# Patient Record
Sex: Female | Born: 1995 | Race: White | Hispanic: No | Marital: Single | Smoking: Former smoker
Health system: Southern US, Community
[De-identification: ages and names within clinical notes are randomized; demographics above are authoritative.]

## PROBLEM LIST (undated history)

## (undated) DIAGNOSIS — Z789 Other specified health status: Secondary | ICD-10-CM

## (undated) HISTORY — PX: TONSILLECTOMY: SUR1361

## (undated) HISTORY — PX: WISDOM TOOTH EXTRACTION: SHX21

---

## 2016-12-17 ENCOUNTER — Emergency Department (HOSPITAL_BASED_OUTPATIENT_CLINIC_OR_DEPARTMENT_OTHER): Payer: Managed Care, Other (non HMO)

## 2016-12-17 ENCOUNTER — Emergency Department (HOSPITAL_BASED_OUTPATIENT_CLINIC_OR_DEPARTMENT_OTHER)
Admission: EM | Admit: 2016-12-17 | Discharge: 2016-12-17 | Disposition: A | Discharge status: Home / Self Care | Payer: Managed Care, Other (non HMO) | Attending: Emergency Medicine | Admitting: Emergency Medicine

## 2016-12-17 ENCOUNTER — Encounter (HOSPITAL_BASED_OUTPATIENT_CLINIC_OR_DEPARTMENT_OTHER): Payer: Self-pay | Admitting: Emergency Medicine

## 2016-12-17 DIAGNOSIS — R1030 Lower abdominal pain, unspecified: Secondary | ICD-10-CM | POA: Diagnosis not present

## 2016-12-17 DIAGNOSIS — F17210 Nicotine dependence, cigarettes, uncomplicated: Secondary | ICD-10-CM | POA: Diagnosis not present

## 2016-12-17 DIAGNOSIS — N83202 Unspecified ovarian cyst, left side: Secondary | ICD-10-CM

## 2016-12-17 DIAGNOSIS — K625 Hemorrhage of anus and rectum: Secondary | ICD-10-CM | POA: Diagnosis present

## 2016-12-17 LAB — CBC WITH DIFFERENTIAL/PLATELET
BASOS ABS: 0 10*3/uL (ref 0.0–0.1)
BASOS PCT: 0 %
EOS ABS: 0.3 10*3/uL (ref 0.0–0.7)
EOS PCT: 2 %
HCT: 40.7 % (ref 36.0–46.0)
Hemoglobin: 13.7 g/dL (ref 12.0–15.0)
LYMPHS PCT: 24 %
Lymphs Abs: 3.2 10*3/uL (ref 0.7–4.0)
MCH: 29.6 pg (ref 26.0–34.0)
MCHC: 33.7 g/dL (ref 30.0–36.0)
MCV: 87.9 fL (ref 78.0–100.0)
MONO ABS: 1.3 10*3/uL — AB (ref 0.1–1.0)
Monocytes Relative: 9 %
Neutro Abs: 8.6 10*3/uL — ABNORMAL HIGH (ref 1.7–7.7)
Neutrophils Relative %: 65 %
PLATELETS: 303 10*3/uL (ref 150–400)
RBC: 4.63 MIL/uL (ref 3.87–5.11)
RDW: 13.3 % (ref 11.5–15.5)
WBC: 13.4 10*3/uL — AB (ref 4.0–10.5)

## 2016-12-17 LAB — BASIC METABOLIC PANEL WITH GFR
Anion gap: 8 (ref 5–15)
BUN: 16 mg/dL (ref 6–20)
CO2: 26 mmol/L (ref 22–32)
Calcium: 9.1 mg/dL (ref 8.9–10.3)
Chloride: 105 mmol/L (ref 101–111)
Creatinine, Ser: 0.75 mg/dL (ref 0.44–1.00)
GFR calc Af Amer: >60 mL/min
GFR calc non Af Amer: >60 mL/min
Glucose, Bld: 92 mg/dL (ref 65–99)
Potassium: 3.8 mmol/L (ref 3.5–5.1)
Sodium: 139 mmol/L (ref 135–145)

## 2016-12-17 LAB — PREGNANCY, URINE: Preg Test, Ur: NEGATIVE

## 2016-12-17 LAB — OCCULT BLOOD X 1 CARD TO LAB, STOOL: FECAL OCCULT BLD: POSITIVE — AB

## 2016-12-17 MED ORDER — METRONIDAZOLE 500 MG PO TABS: 500.0000 mg | ORAL_TABLET | Freq: Three times a day (TID) | ORAL | 0 refills | Status: DC

## 2016-12-17 MED ORDER — HYDROCORTISONE ACETATE 25 MG RE SUPP: 25.0000 mg | Freq: Two times a day (BID) | RECTAL | 0 refills | Status: DC

## 2016-12-17 MED ORDER — IOPAMIDOL (ISOVUE-300) INJECTION 61%
100.0000 mL | Freq: Once | INTRAVENOUS | Status: AC | PRN
  Administered 2016-12-17: 100 mL via INTRAVENOUS

## 2016-12-17 NOTE — ED Triage Notes (Signed)
Pt states visible blood with BM's x 2 days. Denies dyspnea on exertion or lightheadedness when standing, or other symptoms.

## 2016-12-17 NOTE — ED Provider Notes (Signed)
[facility] Provider Note   CSN: [id number] Arrival date & time: 12/17/16  4098     History   Chief Complaint Chief Complaint  Patient presents with  . Rectal Bleeding    HPI Heidi David is a 20 y.o. female.  Patient presents to the emergency part for evaluation of rectal bleeding. Patient reports for the last 2 days she has had bright red blood mixed with her stool in the morning. She has had some discomfort in the low abdomen as well. No nausea or vomiting associated with symptoms. There has not been diarrhea.      History reviewed. No pertinent past medical history.  There are no active problems to display for this patient.   Past Surgical History:  Procedure Laterality Date  . TONSILLECTOMY     tonsils and adenoids  . WISDOM TOOTH EXTRACTION      OB History    No data available       Home Medications    Prior to Admission medications   Not on File    Family History No family history on file.  Social History Social History  Substance Use Topics  . Smoking status: Current Every Day Smoker    Packs/day: 0.50    Types: Cigarettes  . Smokeless tobacco: Never Used  . Alcohol use Yes     Comment: occasional     Allergies   Codeine; Hydrocodone; and Oxycodone   Review of Systems Review of Systems  Gastrointestinal: Positive for blood in stool. Negative for diarrhea.  All other systems reviewed and are negative.    Physical Exam Updated Vital Signs BP 126/77 (BP Location: Left Arm)   Pulse 95   Temp 98.3 F (36.8 C) (Oral)   Resp 16   Ht 5\' 2"  (1.575 m)   Wt 160 lb (72.6 kg)   LMP 11/24/2016 (Exact Date)   SpO2 99%   BMI 29.26 kg/m   Physical Exam  Constitutional: She is oriented to person, place, and time. She appears well-developed and well-nourished. No distress.  HENT:  Head: Normocephalic and atraumatic.  Right Ear: Hearing normal.  Left Ear: Hearing normal.  Nose: Nose normal.  Mouth/Throat: Oropharynx is  clear and moist and mucous membranes are normal.  Eyes: Conjunctivae and EOM are normal. Pupils are equal, round, and reactive to light.  Neck: Normal range of motion. Neck supple.  Cardiovascular: Regular rhythm, S1 normal and S2 normal.  Exam reveals no gallop and no friction rub.   No murmur heard. Pulmonary/Chest: Effort normal and breath sounds normal. No respiratory distress. She exhibits no tenderness.  Abdominal: Soft. Normal appearance and bowel sounds are normal. There is no hepatosplenomegaly. There is no tenderness. There is no rebound, no guarding, no tenderness at McBurney's point and negative Murphy's sign. No hernia.  Genitourinary: Rectum normal. Rectal exam shows no external hemorrhoid, no internal hemorrhoid and no mass.  Musculoskeletal: Normal range of motion.  Neurological: She is alert and oriented to person, place, and time. She has normal strength. No cranial nerve deficit or sensory deficit. Coordination normal. GCS eye subscore is 4. GCS verbal subscore is 5. GCS motor subscore is 6.  Skin: Skin is warm, dry and intact. No rash noted. No cyanosis.  Psychiatric: She has a normal mood and affect. Her speech is normal and behavior is normal. Thought content normal.  Nursing note and vitals reviewed.    ED Treatments / Results  Labs (all labs ordered are listed, but only abnormal results  are displayed) Labs Reviewed  CBC WITH DIFFERENTIAL/PLATELET - Abnormal; Notable for the following:       Result Value   WBC 13.4 (*)    Neutro Abs 8.6 (*)    Monocytes Absolute 1.3 (*)    All other components within normal limits  OCCULT BLOOD X 1 CARD TO LAB, STOOL - Abnormal; Notable for the following:    Fecal Occult Bld POSITIVE (*)    All other components within normal limits  BASIC METABOLIC PANEL  PREGNANCY, URINE    EKG  EKG Interpretation None       Radiology Ct Abdomen Pelvis W Contrast  Result Date: 12/17/2016 CLINICAL DATA:  Lower abdominal pain with  rectal bleeding for 2 days. EXAM: CT ABDOMEN AND PELVIS WITH CONTRAST TECHNIQUE: Multidetector CT imaging of the abdomen and pelvis was performed using the standard protocol following bolus administration of intravenous contrast. CONTRAST:  100mL ISOVUE-300 IOPAMIDOL (ISOVUE-300) INJECTION 61% COMPARISON:  None. FINDINGS: Lower chest: The visualized lung bases are clear. Hepatobiliary: No focal liver abnormality is seen. No gallstones, gallbladder wall thickening, or biliary dilatation. Pancreas: Unremarkable aside from a subcentimeter focus of fat in the pancreatic head. Spleen: Unremarkable. Adrenals/Urinary Tract: Unremarkable adrenal glands. No evidence of renal calculi or hydronephrosis. 9 mm low-density lesion in the lower pole of the right kidney is most compatible with a cyst. Unremarkable bladder. Stomach/Bowel: The stomach is within normal limits. There is no evidence of bowel wall thickening or dilatation. The appendix is unremarkable. Vascular/Lymphatic: No significant vascular findings. Small retroperitoneal lymph nodes measure up to 7 mm in short axis in the left para-aortic station. There is an increased number of lymph nodes throughout the mesentery which are mildly enlarged and measure up to 9 mm in short axis in the right lower quadrant. Reproductive: Unremarkable uterus. Asymmetric enlargement of the left ovary which measures approximately 6 x 3 cm with a possible underlying 2.1 cm corpus luteum. Unremarkable right adnexa. Other: No intraperitoneal free fluid. No abdominal wall mass or hernia. Musculoskeletal: No acute or significant osseous findings. IMPRESSION: 1. Numerous small/mildly enlarged mesenteric lymph nodes, nonspecific but may be reactive (such as from an underlying infectious or inflammatory bowel process). 2. Unremarkable CT appearance of the bowel. 3. Mildly enlarged left ovary. Nonurgent evaluation with pelvic ultrasound is recommended. Electronically Signed   By: Sebastian AcheAllen  Grady  M.D.   On: 12/17/2016 07:16    Procedures Procedures (including critical care time)  Medications Ordered in ED Medications  iopamidol (ISOVUE-300) 61 % injection 100 mL (100 mLs Intravenous Contrast Given 12/17/16 0612)     Initial Impression / Assessment and Plan / ED Course  I have reviewed the triage vital signs and the nursing notes.  Pertinent labs & imaging results that were available during my care of the patient were reviewed by me and considered in my medical decision making (see chart for details).  Clinical Course   Patient with rectal bleeding and lower abdominal pain. Examination did reveal suprapubic tenderness but no guarding or rebound. Rectal exam revealed brown stool that was heme positive. No external hemorrhoids. No palpable enlarged internal hemorrhoids or mass. Patient did have an elevated white blood cell count and reports that she had a fever 2 days ago. CT scan was therefore performed. No evidence of colitis or other explanation for the rectal bleeding. She does have ovarian cyst that will be referred to OB/GYN and is possibly part of her symptomatology. No other acute treatment will be necessary other than Anusol  HC Suppository. Follow-up with GI as well.  Final Clinical Impressions(s) / ED Diagnoses   Final diagnoses:  Rectal bleeding    New Prescriptions New Prescriptions   No medications on file     Gilda Creasehristopher J Pollina, MD 12/17/16 [phone]

## 2017-01-22 ENCOUNTER — Encounter (HOSPITAL_BASED_OUTPATIENT_CLINIC_OR_DEPARTMENT_OTHER): Payer: Self-pay

## 2017-01-22 ENCOUNTER — Emergency Department (HOSPITAL_BASED_OUTPATIENT_CLINIC_OR_DEPARTMENT_OTHER)
Admission: EM | Admit: 2017-01-22 | Discharge: 2017-01-22 | Disposition: A | Discharge status: Home / Self Care | Payer: Managed Care, Other (non HMO) | Attending: Dermatology | Admitting: Dermatology

## 2017-01-22 DIAGNOSIS — F17210 Nicotine dependence, cigarettes, uncomplicated: Secondary | ICD-10-CM | POA: Diagnosis not present

## 2017-01-22 DIAGNOSIS — Z5321 Procedure and treatment not carried out due to patient leaving prior to being seen by health care provider: Secondary | ICD-10-CM | POA: Diagnosis not present

## 2017-01-22 DIAGNOSIS — M545 Low back pain: Secondary | ICD-10-CM | POA: Diagnosis present
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
## 2019-01-17 ENCOUNTER — Other Ambulatory Visit: Payer: Self-pay

## 2019-01-17 ENCOUNTER — Encounter (HOSPITAL_BASED_OUTPATIENT_CLINIC_OR_DEPARTMENT_OTHER): Payer: Self-pay | Admitting: Emergency Medicine

## 2019-01-17 ENCOUNTER — Emergency Department (HOSPITAL_BASED_OUTPATIENT_CLINIC_OR_DEPARTMENT_OTHER)
Admission: EM | Admit: 2019-01-17 | Discharge: 2019-01-17 | Disposition: A | Discharge status: Home / Self Care | Payer: Managed Care, Other (non HMO) | Attending: Emergency Medicine | Admitting: Emergency Medicine

## 2019-01-17 DIAGNOSIS — Y992 Volunteer activity: Secondary | ICD-10-CM | POA: Insufficient documentation

## 2019-01-17 DIAGNOSIS — T300 Burn of unspecified body region, unspecified degree: Secondary | ICD-10-CM

## 2019-01-17 DIAGNOSIS — T22152A Burn of first degree of left shoulder, initial encounter: Secondary | ICD-10-CM | POA: Insufficient documentation

## 2019-01-17 DIAGNOSIS — F17210 Nicotine dependence, cigarettes, uncomplicated: Secondary | ICD-10-CM | POA: Insufficient documentation

## 2019-01-17 DIAGNOSIS — T22151A Burn of first degree of right shoulder, initial encounter: Secondary | ICD-10-CM | POA: Insufficient documentation

## 2019-01-17 DIAGNOSIS — Y9389 Activity, other specified: Secondary | ICD-10-CM | POA: Insufficient documentation

## 2019-01-17 DIAGNOSIS — X010XXA Exposure to flames in uncontrolled fire, not in building or structure, initial encounter: Secondary | ICD-10-CM | POA: Insufficient documentation

## 2019-01-17 DIAGNOSIS — Y929 Unspecified place or not applicable: Secondary | ICD-10-CM | POA: Insufficient documentation

## 2019-01-17 MED ORDER — SILVER SULFADIAZINE 1 % EX CREA
TOPICAL_CREAM | Freq: Once | CUTANEOUS | Status: AC
  Administered 2019-01-17: 1 via TOPICAL
  Filled 2019-01-17: qty 85

## 2019-01-17 MED ORDER — SILVER SULFADIAZINE 1 % EX CREA: 1.0000 "application " | TOPICAL_CREAM | Freq: Every day | CUTANEOUS | 0 refills | Status: AC

## 2019-01-17 NOTE — Discharge Instructions (Signed)
Take tylenol, motrin for pain   Use silverdene cream daily and keep it covered for 3-4 days then you may leave it open   See your doctor  Return to ER if you have worse pain, fever, purulent discharge
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
## 2019-04-06 ENCOUNTER — Ambulatory Visit: Payer: Self-pay | Admitting: Family Medicine

## 2019-04-24 ENCOUNTER — Encounter (HOSPITAL_COMMUNITY): Payer: Self-pay | Admitting: *Deleted

## 2019-04-24 ENCOUNTER — Inpatient Hospital Stay (HOSPITAL_COMMUNITY)
Admission: AD | Admit: 2019-04-24 | Discharge: 2019-04-24 | Disposition: A | Discharge status: Home / Self Care | Payer: Self-pay | Attending: Obstetrics & Gynecology | Admitting: Obstetrics & Gynecology

## 2019-04-24 DIAGNOSIS — R55 Syncope and collapse: Secondary | ICD-10-CM | POA: Insufficient documentation

## 2019-04-24 DIAGNOSIS — Z3202 Encounter for pregnancy test, result negative: Secondary | ICD-10-CM

## 2019-04-24 DIAGNOSIS — F17210 Nicotine dependence, cigarettes, uncomplicated: Secondary | ICD-10-CM | POA: Insufficient documentation

## 2019-04-24 DIAGNOSIS — N946 Dysmenorrhea, unspecified: Secondary | ICD-10-CM

## 2019-04-24 HISTORY — DX: Other specified health status: Z78.9

## 2019-04-24 LAB — URINALYSIS, ROUTINE W REFLEX MICROSCOPIC
Bilirubin Urine: NEGATIVE
Glucose, UA: NEGATIVE mg/dL
Ketones, ur: NEGATIVE mg/dL
Leukocytes,Ua: NEGATIVE
Nitrite: NEGATIVE
Protein, ur: NEGATIVE mg/dL
Specific Gravity, Urine: 1.024 (ref 1.005–1.030)
pH: 8 (ref 5.0–8.0)

## 2019-04-24 LAB — CBC
HCT: 41.3 % (ref 36.0–46.0)
Hemoglobin: 14 g/dL (ref 12.0–15.0)
MCH: 30.3 pg (ref 26.0–34.0)
MCHC: 33.9 g/dL (ref 30.0–36.0)
MCV: 89.4 fL (ref 80.0–100.0)
Platelets: 319 10*3/uL (ref 150–400)
RBC: 4.62 MIL/uL (ref 3.87–5.11)
RDW: 12.6 % (ref 11.5–15.5)
WBC: 11.3 10*3/uL — ABNORMAL HIGH (ref 4.0–10.5)
nRBC: 0 % (ref 0.0–0.2)

## 2019-04-24 LAB — WET PREP, GENITAL
Clue Cells Wet Prep HPF POC: NONE SEEN
Sperm: NONE SEEN
Trich, Wet Prep: NONE SEEN
Yeast Wet Prep HPF POC: NONE SEEN

## 2019-04-24 LAB — POCT PREGNANCY, URINE: Preg Test, Ur: NEGATIVE

## 2019-04-24 LAB — ABO/RH: ABO/RH(D): O POS

## 2019-04-24 LAB — HCG, QUANTITATIVE, PREGNANCY: hCG, Beta Chain, Quant, S: <1 m[IU]/mL (ref <5)

## 2019-04-24 MED ORDER — SODIUM CHLORIDE 0.9 % IV SOLN
8.0000 mg | Freq: Once | INTRAVENOUS | Status: DC
  Filled 2019-04-24: qty 4

## 2019-04-24 MED ORDER — ONDANSETRON 4 MG PO TBDP
8.0000 mg | ORAL_TABLET | Freq: Once | ORAL | Status: AC
  Administered 2019-04-24: 21:00:00 8 mg via ORAL
  Filled 2019-04-24: qty 2

## 2019-04-24 MED ORDER — ACETAMINOPHEN 500 MG PO TABS
1000.0000 mg | ORAL_TABLET | Freq: Four times a day (QID) | ORAL | Status: DC | PRN
  Administered 2019-04-24: 1000 mg via ORAL
  Filled 2019-04-24: qty 2

## 2019-04-24 NOTE — MAU Provider Note (Signed)
History     CSN: [id number]  Arrival date and time: 04/24/19 0102   First Provider Initiated Contact with Patient 04/24/19 1858      Chief Complaint  Patient presents with  . Vaginal Bleeding   23 y.o. female with VB and LAP. Reports faint +HPT yesterday. Had some spotting yesterday then started having bleeding and passed a clot around 4pm today. Pain started around the same time. Describes as sharp and tearing in bilateral lower abdomen. Rates 10/10. Has not taken anything for it. Movement makes it worse. She is sexually active. She is not using contraception but not trying to conceive.    OB History   No obstetric history on file.     Past Medical History:  Diagnosis Date  . Medical history non-contributory     Past Surgical History:  Procedure Laterality Date  . TONSILLECTOMY     tonsils and adenoids  . WISDOM TOOTH EXTRACTION      History reviewed. No pertinent family history.  Social History   Tobacco Use  . Smoking status: Current Every Day Smoker    Packs/day: 0.50    Types: Cigarettes  . Smokeless tobacco: Never Used  Substance Use Topics  . Alcohol use: Yes    Comment: occasional  . Drug use: No    Allergies:  Allergies  Allergen Reactions  . Codeine   . Hydrocodone   . Ibuprofen   . Oxycodone     Medications Prior to Admission  Medication Sig Dispense Refill Last Dose  . Pseudoephedrine-APAP-DM (DAYQUIL PO) Take by mouth.     . silver sulfADIAZINE (SILVADENE) 1 % cream Apply 1 application topically daily. 50 g 0     Review of Systems  Gastrointestinal: Positive for abdominal pain.  Genitourinary: Positive for vaginal bleeding.   Physical Exam   Blood pressure 120/80, pulse 97, temperature 98.1 F (36.7 C), temperature source Oral, resp. rate 19, last menstrual period 03/20/2019.  Physical Exam  Nursing note and vitals reviewed. Constitutional: She is oriented to person, place, and time. She appears well-developed and well-nourished.  No distress.  HENT:  Head: Normocephalic and atraumatic.  Neck: Normal range of motion.  Respiratory: Effort normal. No respiratory distress.  GI: Soft. She exhibits no distension and no mass. There is no abdominal tenderness. There is no rebound and no guarding.  Genitourinary:    Genitourinary Comments: External: no lesions or erythema Vagina: rugated, pink, moist, small bloody discharge, cleared with 1 fox swab Uterus: non enlarged, anteverted, + tender, no CMT Adnexae: no masses, no tenderness left, no tenderness right Cervix closed, nml    Musculoskeletal: Normal range of motion.  Neurological: She is alert and oriented to person, place, and time.  Skin: Skin is warm and dry.  Psychiatric: She has a normal mood and affect.   Results for orders placed or performed during the hospital encounter of 04/24/19 (from the past 24 hour(s))  Urinalysis, Routine w reflex microscopic     Status: Abnormal   Collection Time: 04/24/19  6:38 PM  Result Value Ref Range   Color, Urine YELLOW YELLOW   APPearance CLEAR CLEAR   Specific Gravity, Urine 1.024 1.005 - 1.030   pH 8.0 5.0 - 8.0   Glucose, UA NEGATIVE NEGATIVE mg/dL   Hgb urine dipstick LARGE (A) NEGATIVE   Bilirubin Urine NEGATIVE NEGATIVE   Ketones, ur NEGATIVE NEGATIVE mg/dL   Protein, ur NEGATIVE NEGATIVE mg/dL   Nitrite NEGATIVE NEGATIVE   Leukocytes,Ua NEGATIVE NEGATIVE   RBC /  HPF 21-50 0 - 5 RBC/hpf   WBC, UA 0-5 0 - 5 WBC/hpf   Bacteria, UA RARE (A) NONE SEEN   Squamous Epithelial / LPF 0-5 0 - 5   Mucus PRESENT   Pregnancy, urine POC     Status: None   Collection Time: 04/24/19  6:42 PM  Result Value Ref Range   Preg Test, Ur NEGATIVE NEGATIVE  ABO/Rh     Status: None   Collection Time: 04/24/19  7:37 PM  Result Value Ref Range   ABO/RH(D) O POS    No rh immune globuloin      NOT A RH IMMUNE GLOBULIN CANDIDATE, PT RH POSITIVE Performed at [facility], [address] [place], [place] [address]   CBC      Status: Abnormal   Collection Time: 04/24/19  7:37 PM  Result Value Ref Range   WBC 11.3 (H) 4.0 - 10.5 K/uL   RBC 4.62 3.87 - 5.11 MIL/uL   Hemoglobin 14.0 12.0 - 15.0 g/dL   HCT 88.4 16.6 - 06.3 %   MCV 89.4 80.0 - 100.0 fL   MCH 30.3 26.0 - 34.0 pg   MCHC 33.9 30.0 - 36.0 g/dL   RDW 01.6 01.0 - 93.2 %   Platelets 319 150 - 400 K/uL   nRBC 0.0 0.0 - 0.2 %  hCG, quantitative, pregnancy     Status: None   Collection Time: 04/24/19  7:37 PM  Result Value Ref Range   hCG, Beta Chain, Quant, S <1 <5 mIU/mL  Wet prep, genital     Status: Abnormal   Collection Time: 04/24/19  7:39 PM  Result Value Ref Range   Yeast Wet Prep HPF POC NONE SEEN NONE SEEN   Trich, Wet Prep NONE SEEN NONE SEEN   Clue Cells Wet Prep HPF POC NONE SEEN NONE SEEN   WBC, Wet Prep HPF POC FEW (A) NONE SEEN   Sperm NONE SEEN    MAU Course  Procedures Orders Placed This Encounter  Procedures  . Wet prep, genital    Standing Status:   Standing    Number of Occurrences:   1    Order Specific Question:   Patient immune status    Answer:   Normal  . Urinalysis, Routine w reflex microscopic    Standing Status:   Standing    Number of Occurrences:   1  . CBC    Standing Status:   Standing    Number of Occurrences:   1  . hCG, quantitative, pregnancy    Standing Status:   Standing    Number of Occurrences:   1  . Pregnancy, urine POC    Standing Status:   Standing    Number of Occurrences:   1  . ABO/Rh    Standing Status:   Standing    Number of Occurrences:   1  . Discharge patient    Order Specific Question:   Discharge disposition    Answer:   01-Home or Self Care [1]    Order Specific Question:   Discharge patient date    Answer:   04/24/2019   Meds ordered this encounter  Medications  . acetaminophen (TYLENOL) tablet 1,000 mg  . DISCONTD: ondansetron (ZOFRAN) 8 mg in sodium chloride 0.9 % 50 mL IVPB  . ondansetron (ZOFRAN-ODT) disintegrating tablet 8 mg   MDM UPT negative, labs ordered. No  evidence of pregnancy. No evidence of acute abd process. Sx consistent with dysmenorrhea.  Stable for discharge home.   Assessment and Plan   1. Dysmenorrhea   2. Pregnancy test negative    Discharge home Follow up with [facility] as needed Tylenol prn Tubs soaks for comfort  Allergies as of 04/24/2019      Reactions   Codeine    Hydrocodone    Ibuprofen    Oxycodone       Medication List    TAKE these medications   DAYQUIL PO Take by mouth.   silver sulfADIAZINE 1 % cream Commonly known as:  SILVADENE Apply 1 application topically daily.      Donette LarryMelanie Lynnix Schoneman, CNM 04/24/2019, 9:01 PM
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
## 2019-04-24 NOTE — Discharge Instructions (Signed)
Dysmenorrhea  Dysmenorrhea means painful cramps during your period (menstrual period). You will have pain in your lower belly (abdomen). The pain is caused by the tightening (contracting) of the muscles of the womb (uterus). The pain may be mild or very bad. With this condition, you may:  Have a headache.  Feel sick to your stomach (nauseous).  Throw up (vomit).  Have lower back pain.  Follow these instructions at home:  Helping pain and cramping    Put heat on your lower back or belly when you have pain or cramps. Use the heat source that your doctor tells you to use.  Place a towel between your skin and the heat.  Leave the heat on for 20-30 minutes.  Remove the heat if your skin turns bright red. This is especially important if you cannot feel pain, heat, or cold.  Do not have a heating pad on during sleep.  Do aerobic exercises. These include walking, swimming, or biking. These may help with cramps.  Massage your lower back or belly. This may help lessen pain.  General instructions  Take over-the-counter and prescription medicines only as told by your doctor.  Do not drive or use heavy machinery while taking prescription pain medicine.  Avoid alcohol and caffeine during and right before your period. These can make cramps worse.  Do not use any products that have nicotine or tobacco. These include cigarettes and e-cigarettes. If you need help quitting, ask your doctor.  Keep all follow-up visits as told by your doctor. This is important.  Contact a doctor if:  You have pain that gets worse.  You have pain that does not get better with medicine.  You have pain during sex.  You feel sick to your stomach or you throw up during your period, and medicine does not help.  Get help right away if:  You pass out (faint).  Summary  Dysmenorrhea means painful cramps during your period (menstrual period).  Put heat on your lower back or belly when you have pain or cramps.  Do exercises like walking, swimming, or biking to  help with cramps.  Contact a doctor if you have pain during sex.  This information is not intended to replace advice given to you by your health care provider. Make sure you discuss any questions you have with your health care provider.  Document Released: 03/15/2009 Document Revised: 01/03/2017 Document Reviewed: 01/03/2017  Elsevier Interactive Patient Education  2019 [employer]

## 2019-04-27 LAB — GC/CHLAMYDIA PROBE AMP (~~LOC~~) NOT AT ARMC
Chlamydia: NEGATIVE
Neisseria Gonorrhea: NEGATIVE

## 2019-09-22 ENCOUNTER — Other Ambulatory Visit: Payer: Self-pay

## 2019-09-22 ENCOUNTER — Emergency Department (HOSPITAL_BASED_OUTPATIENT_CLINIC_OR_DEPARTMENT_OTHER)
Admission: EM | Admit: 2019-09-22 | Discharge: 2019-09-23 | Disposition: A | Discharge status: Home / Self Care | Payer: Self-pay | Attending: Emergency Medicine | Admitting: Emergency Medicine

## 2019-09-22 DIAGNOSIS — G43809 Other migraine, not intractable, without status migrainosus: Secondary | ICD-10-CM | POA: Insufficient documentation

## 2019-09-22 DIAGNOSIS — F17210 Nicotine dependence, cigarettes, uncomplicated: Secondary | ICD-10-CM | POA: Insufficient documentation

## 2019-09-22 DIAGNOSIS — R55 Syncope and collapse: Secondary | ICD-10-CM

## 2019-09-22 LAB — CBC
HCT: 44.5 % (ref 36.0–46.0)
Hemoglobin: 14.6 g/dL (ref 12.0–15.0)
MCH: 30.1 pg (ref 26.0–34.0)
MCHC: 32.8 g/dL (ref 30.0–36.0)
MCV: 91.8 fL (ref 80.0–100.0)
Platelets: 330 10*3/uL (ref 150–400)
RBC: 4.85 MIL/uL (ref 3.87–5.11)
RDW: 12.6 % (ref 11.5–15.5)
WBC: 10.2 10*3/uL (ref 4.0–10.5)
nRBC: 0 % (ref 0.0–0.2)

## 2019-09-22 LAB — URINALYSIS, ROUTINE W REFLEX MICROSCOPIC
Bilirubin Urine: NEGATIVE
Glucose, UA: NEGATIVE mg/dL
Hgb urine dipstick: NEGATIVE
Ketones, ur: NEGATIVE mg/dL
Leukocytes,Ua: NEGATIVE
Nitrite: NEGATIVE
Protein, ur: NEGATIVE mg/dL
Specific Gravity, Urine: <1.005 — ABNORMAL LOW (ref 1.005–1.030)
pH: 6 (ref 5.0–8.0)

## 2019-09-22 LAB — BASIC METABOLIC PANEL
Anion gap: 10 (ref 5–15)
BUN: 11 mg/dL (ref 6–20)
CO2: 25 mmol/L (ref 22–32)
Calcium: 9.1 mg/dL (ref 8.9–10.3)
Chloride: 102 mmol/L (ref 98–111)
Creatinine, Ser: 0.68 mg/dL (ref 0.44–1.00)
GFR calc Af Amer: >60 mL/min (ref >60)
GFR calc non Af Amer: >60 mL/min (ref >60)
Glucose, Bld: 124 mg/dL — ABNORMAL HIGH (ref 70–99)
Potassium: 3.7 mmol/L (ref 3.5–5.1)
Sodium: 137 mmol/L (ref 135–145)

## 2019-09-22 LAB — CBG MONITORING, ED: Glucose-Capillary: 126 mg/dL — ABNORMAL HIGH (ref 70–99)

## 2019-09-22 LAB — PREGNANCY, URINE: Preg Test, Ur: NEGATIVE

## 2019-09-22 MED ORDER — PROCHLORPERAZINE EDISYLATE 10 MG/2ML IJ SOLN
10.0000 mg | Freq: Once | INTRAMUSCULAR | Status: AC
  Administered 2019-09-23: 10 mg via INTRAVENOUS
  Filled 2019-09-22: qty 2

## 2019-09-22 MED ORDER — DIPHENHYDRAMINE HCL 50 MG/ML IJ SOLN
25.0000 mg | Freq: Once | INTRAMUSCULAR | Status: AC
  Administered 2019-09-23: 25 mg via INTRAVENOUS
  Filled 2019-09-22: qty 1

## 2019-09-22 MED ORDER — DEXAMETHASONE SODIUM PHOSPHATE 10 MG/ML IJ SOLN
10.0000 mg | Freq: Once | INTRAMUSCULAR | Status: AC
  Administered 2019-09-23: 10 mg via INTRAVENOUS
  Filled 2019-09-22: qty 1

## 2019-09-22 MED ORDER — SODIUM CHLORIDE 0.9 % IV BOLUS
500.0000 mL | Freq: Once | INTRAVENOUS | Status: AC
  Administered 2019-09-23: 500 mL via INTRAVENOUS

## 2019-09-22 NOTE — ED Triage Notes (Signed)
Presents with multiple episodes of anxiety and panic attacks for the past 2-3 days. The anxiety makes her feel like her whole body is numb. She states she has had these before but not to this severity. She states that she has severe head pain that started 3 days ago and had a syncopal episode this evening while going up the stairs. She hit her head at that time. Neurologically intact.   She has an appointment with a primary care doctor on Friday.

## 2019-09-22 NOTE — ED Provider Notes (Signed)
[facility] EMERGENCY DEPARTMENT Provider Note  CSN: [id number] Arrival date & time: 09/22/19 2026  Chief Complaint(s) No chief complaint on file.  HPI Heidi David is a 23 y.o. female   The history is provided by the patient.  Loss of Consciousness Episode history:  Single Most recent episode:  Today Duration:  10 minutes Progression:  Resolved Chronicity:  New Context comment:  Walking up the stairs Witnessed: no   Relieved by:  Nothing Worsened by:  Nothing Associated symptoms: anxiety, headaches and malaise/fatigue   Associated symptoms: no chest pain, no dizziness, no fever, no focal sensory loss, no nausea, no seizures, no shortness of breath, no vomiting and no weakness     Past Medical History Past Medical History:  Diagnosis Date  . Medical history non-contributory    There are no active problems to display for this patient.  Home Medication(s) Prior to Admission medications   Medication Sig Start Date End Date Taking? Authorizing Provider  Pseudoephedrine-APAP-DM (DAYQUIL PO) Take by mouth.    [provider]  silver sulfADIAZINE (SILVADENE) 1 % cream Apply 1 application topically daily. 01/17/19   Charlynne Pander, MD                                                                                                                                    Past Surgical History Past Surgical History:  Procedure Laterality Date  . TONSILLECTOMY     tonsils and adenoids  . WISDOM TOOTH EXTRACTION     Family History No family history on file.  Social History Social History   Tobacco Use  . Smoking status: Current Every Day Smoker    Packs/day: 0.50    Types: Cigarettes  . Smokeless tobacco: Never Used  Substance Use Topics  . Alcohol use: Yes    Comment: occasional  . Drug use: No   Allergies Codeine, Hydrocodone, Ibuprofen, and Oxycodone  Review of Systems Review of Systems  Constitutional: Positive for malaise/fatigue. Negative  for fever.  Respiratory: Negative for shortness of breath.   Cardiovascular: Positive for syncope. Negative for chest pain.  Gastrointestinal: Negative for nausea and vomiting.  Neurological: Positive for headaches. Negative for dizziness, seizures and weakness.   All other systems are reviewed and are negative for acute change except as noted in the HPI  Physical Exam Vital Signs  I have reviewed the triage vital signs BP 119/79 (BP Location: Right Arm)   Pulse 94   Temp 98.5 F (36.9 C) (Oral)   Resp 16   Ht 5\' 2"  (1.575 m)   Wt 79.4 kg   SpO2 100%   BMI 32.01 kg/m   Physical Exam Vitals signs reviewed.  Constitutional:      General: She is not in acute distress.    Appearance: She is well-developed. She is not diaphoretic.  HENT:     Head: Normocephalic and atraumatic.     Nose: Nose  normal.  Eyes:     General: No scleral icterus.       Right eye: No discharge.        Left eye: No discharge.     Conjunctiva/sclera: Conjunctivae normal.     Pupils: Pupils are equal, round, and reactive to light.  Neck:     Musculoskeletal: Normal range of motion and neck supple.  Cardiovascular:     Rate and Rhythm: Normal rate and regular rhythm.     Heart sounds: No murmur. No friction rub. No gallop.   Pulmonary:     Effort: Pulmonary effort is normal. No respiratory distress.     Breath sounds: Normal breath sounds. No stridor. No rales.  Abdominal:     General: There is no distension.     Palpations: Abdomen is soft.     Tenderness: There is no abdominal tenderness.  Musculoskeletal:        General: No tenderness.  Skin:    General: Skin is warm and dry.     Findings: No erythema or rash.  Neurological:     Mental Status: She is alert and oriented to person, place, and time.     Comments: Mental Status:  Alert and oriented to person, place, and time.  Attention and concentration normal.  Speech clear.  Recent memory is intact  Cranial Nerves:  II Visual Fields:  Intact to confrontation. Visual fields intact. III, IV, VI: Pupils equal and reactive to light and near. Full eye movement without nystagmus  V Facial Sensation: Normal. No weakness of masticatory muscles  VII: No facial weakness or asymmetry  VIII Auditory Acuity: Grossly normal  IX/X: The uvula is midline; the palate elevates symmetrically  XI: Normal sternocleidomastoid and trapezius strength  XII: The tongue is midline. No atrophy or fasciculations.   Motor System: Muscle Strength: 5/5 and symmetric in the upper and lower extremities. No pronation or drift.  Muscle Tone: Tone and muscle bulk are normal in the upper and lower extremities.   Reflexes: DTRs: 1+ and symmetrical in all four extremities. No Clonus Coordination: Intact finger-to-nose.  Sensation: Intact to light touch.  Gait: Routine  gait normal.     ED Results and Treatments Labs (all labs ordered are listed, but only abnormal results are displayed) Labs Reviewed  BASIC METABOLIC PANEL - Abnormal; Notable for the following components:      Result Value   Glucose, Bld 124 (*)    All other components within normal limits  URINALYSIS, ROUTINE W REFLEX MICROSCOPIC - Abnormal; Notable for the following components:   Specific Gravity, Urine <1.005 (*)    All other components within normal limits  CBG MONITORING, ED - Abnormal; Notable for the following components:   Glucose-Capillary 126 (*)    All other components within normal limits  CBC  PREGNANCY, URINE  EKG  EKG Interpretation  Date/Time:  Tuesday September 22 2019 20:46:40 EDT Ventricular Rate:  88 PR Interval:  140 QRS Duration: 74 QT Interval:  348 QTC Calculation: 421 R Axis:   74 Text Interpretation:  Normal sinus rhythm Normal ECG NO STEMI. No old tracing to compare Confirmed by Addison Lank [phone]) on 09/22/2019 11:51:07 PM       Radiology No results found.  Pertinent labs & imaging results that were available during my care of the patient were reviewed by me and considered in my medical decision making (see chart for details).  Medications Ordered in ED Medications  sodium chloride 0.9 % bolus 500 mL ( Intravenous Stopped 09/23/19 0119)  diphenhydrAMINE (BENADRYL) injection 25 mg (25 mg Intravenous Given 09/23/19 0018)  prochlorperazine (COMPAZINE) injection 10 mg (10 mg Intravenous Given 09/23/19 0018)  dexamethasone (DECADRON) injection 10 mg (10 mg Intravenous Given 09/23/19 0018)                                                                                                                                    Procedures Procedures  (including critical care time)  Medical Decision Making / ED Course I have reviewed the nursing notes for this encounter and the patient's prior records (if available in EHR or on provided paperwork).   Carlyn Lemke was evaluated in Emergency Department on 09/23/2019 for the symptoms described in the history of present illness. She was evaluated in the context of the global COVID-19 pandemic, which necessitated consideration that the patient might be at risk for infection with the SARS-CoV-2 virus that causes COVID-19. Institutional protocols and algorithms that pertain to the evaluation of patients at risk for COVID-19 are in a state of rapid change based on information released by regulatory bodies including the CDC and federal and state organizations. These policies and algorithms were followed during the patient's care in the ED.      Patient presents with several days of migraine type headache.  No fevers or chills.  Patient does endorse fatigue.  Reports syncopal episode at home.    Patient is afebrile with stable vital signs.  Nontoxic.  Orthostatics borderline.  Exam nonfocal.  Doubt meningitis, ICH, subarachnoid.   Screening labs obtained in triage were grossly reassuring  without evidence of leukocytosis or anemia.  Beta-hCG negative.  EKG without acute ischemic changes, dysrhythmias, block, evidence of Brugada or HOCM.  No need for imaging at this time.  Treated with IV fluids and migraine cocktail resulting in complete resolution of patient's symptoms.  Final Clinical Impression(s) / ED Diagnoses Final diagnoses:  Other migraine without status migrainosus, not intractable  Vasovagal syncope     The patient appears reasonably screened and/or stabilized for discharge and I doubt any other medical condition or other [facility] requiring further screening, evaluation, or treatment in the ED at this time prior to discharge.  Disposition: Discharge  Condition: Good  I have discussed  the results, Dx and Tx plan with the patient who expressed understanding and agree(s) with the plan. Discharge instructions discussed at great length. The patient was given strict return precautions who verbalized understanding of the instructions. No further questions at time of discharge.    ED Discharge Orders    None        Follow Up: Primary care provider  Schedule an appointment as soon as possible for a visit       This chart was dictated using voice recognition software.  Despite best efforts to proofread,  errors can occur which can change the documentation meaning.   Nira Connardama, Pedro Eduardo, MD 09/23/19 [phone]

## 2019-10-21 ENCOUNTER — Emergency Department (HOSPITAL_BASED_OUTPATIENT_CLINIC_OR_DEPARTMENT_OTHER)
Admission: EM | Admit: 2019-10-21 | Discharge: 2019-10-21 | Disposition: A | Discharge status: Home / Self Care | Payer: Self-pay | Attending: Emergency Medicine | Admitting: Emergency Medicine

## 2019-10-21 ENCOUNTER — Other Ambulatory Visit: Payer: Self-pay

## 2019-10-21 ENCOUNTER — Emergency Department (HOSPITAL_BASED_OUTPATIENT_CLINIC_OR_DEPARTMENT_OTHER): Payer: Self-pay

## 2019-10-21 DIAGNOSIS — F419 Anxiety disorder, unspecified: Secondary | ICD-10-CM | POA: Insufficient documentation

## 2019-10-21 DIAGNOSIS — M25512 Pain in left shoulder: Secondary | ICD-10-CM | POA: Insufficient documentation

## 2019-10-21 DIAGNOSIS — R42 Dizziness and giddiness: Secondary | ICD-10-CM | POA: Insufficient documentation

## 2019-10-21 DIAGNOSIS — Z79899 Other long term (current) drug therapy: Secondary | ICD-10-CM | POA: Insufficient documentation

## 2019-10-21 DIAGNOSIS — F17210 Nicotine dependence, cigarettes, uncomplicated: Secondary | ICD-10-CM | POA: Insufficient documentation

## 2019-10-21 LAB — URINALYSIS, ROUTINE W REFLEX MICROSCOPIC
Bilirubin Urine: NEGATIVE
Glucose, UA: NEGATIVE mg/dL
Hgb urine dipstick: NEGATIVE
Ketones, ur: NEGATIVE mg/dL
Leukocytes,Ua: NEGATIVE
Nitrite: NEGATIVE
Protein, ur: NEGATIVE mg/dL
Specific Gravity, Urine: <1.005 — ABNORMAL LOW (ref 1.005–1.030)
pH: 8 (ref 5.0–8.0)

## 2019-10-21 LAB — PREGNANCY, URINE: Preg Test, Ur: NEGATIVE

## 2019-10-21 MED ORDER — ACETAMINOPHEN 500 MG PO TABS
1000.0000 mg | ORAL_TABLET | Freq: Once | ORAL | Status: AC
  Administered 2019-10-21: 1000 mg via ORAL
  Filled 2019-10-21: qty 2

## 2019-10-21 MED ORDER — HYDROXYZINE HCL 25 MG PO TABS: 25.0000 mg | ORAL_TABLET | Freq: Three times a day (TID) | ORAL | 0 refills | Status: DC | PRN

## 2019-10-21 MED ORDER — HYDROXYZINE HCL 25 MG PO TABS
25.0000 mg | ORAL_TABLET | Freq: Once | ORAL | Status: AC
  Administered 2019-10-21: 25 mg via ORAL
  Filled 2019-10-21: qty 1

## 2019-10-21 NOTE — Discharge Instructions (Addendum)
Use Tylenol as needed for pain control.  You may take up to 1000 mg 3 times a day. Use a sling as needed for comfort. Use muscle cream/patches such asIcyHot, BenGay, Biofreeze to help with shoulder pain. Use ice to help with pain and swelling. Make sure you're staying well-hydrated water. You may try Atarax as needed for anxiety. Follow-up with [facility] as needed for further evaluation of primary care needs. There is information in the paperwork about counseling programs (it is bundled with substance abuse programs, the descriptions discuss which services are provided.) Return to the emergency room with any new, sudden, concerning symptoms.
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
## 2019-10-21 NOTE — ED Triage Notes (Signed)
Pt states during intercourse she was using left arm to prop herself up and she felt as if shoulder popped out of place, when she fell onto bed it popped back into place Friday night, it is tingling and numb now. Dizziness x4-5 days, states eating helps it subside.

## 2019-11-17 ENCOUNTER — Other Ambulatory Visit: Payer: Self-pay

## 2019-11-17 ENCOUNTER — Encounter (HOSPITAL_BASED_OUTPATIENT_CLINIC_OR_DEPARTMENT_OTHER): Payer: Self-pay | Admitting: *Deleted

## 2019-11-17 ENCOUNTER — Emergency Department (HOSPITAL_BASED_OUTPATIENT_CLINIC_OR_DEPARTMENT_OTHER)
Admission: EM | Admit: 2019-11-17 | Discharge: 2019-11-17 | Disposition: A | Discharge status: Home / Self Care | Payer: Self-pay | Attending: Emergency Medicine | Admitting: Emergency Medicine

## 2019-11-17 DIAGNOSIS — Z885 Allergy status to narcotic agent status: Secondary | ICD-10-CM | POA: Insufficient documentation

## 2019-11-17 DIAGNOSIS — F17210 Nicotine dependence, cigarettes, uncomplicated: Secondary | ICD-10-CM | POA: Insufficient documentation

## 2019-11-17 DIAGNOSIS — F419 Anxiety disorder, unspecified: Secondary | ICD-10-CM | POA: Insufficient documentation

## 2019-11-17 DIAGNOSIS — Z79899 Other long term (current) drug therapy: Secondary | ICD-10-CM | POA: Insufficient documentation

## 2019-11-17 DIAGNOSIS — Z886 Allergy status to analgesic agent status: Secondary | ICD-10-CM | POA: Insufficient documentation

## 2019-11-17 DIAGNOSIS — R42 Dizziness and giddiness: Secondary | ICD-10-CM | POA: Insufficient documentation

## 2019-11-17 MED ORDER — HYDROXYZINE HCL 25 MG PO TABS
12.5000 mg | ORAL_TABLET | Freq: Once | ORAL | Status: AC
  Administered 2019-11-17: 12.5 mg via ORAL
  Filled 2019-11-17: qty 1

## 2019-11-17 MED ORDER — HYDROXYZINE HCL 25 MG PO TABS: 12.5000 mg | ORAL_TABLET | Freq: Every evening | ORAL | 1 refills | Status: AC | PRN

## 2019-11-17 NOTE — ED Provider Notes (Signed)
[facility] EMERGENCY DEPARTMENT Provider Note   CSN: [id number] Arrival date & time: 11/17/19  2102     History   Chief Complaint Chief Complaint  Patient presents with  . Anxiety    HPI Anastasia Tompson is a 23 y.o. female.  She is complaining of increased anxiety and dizziness since she ran out of her Atarax 2 days ago.  She feels visually like things are moving a little bit and she tripped going up the stairs tonight.  Denies any injury.  No fevers or chills cough blurry vision double vision numbness or weakness.     The history is provided by the patient.  Anxiety This is a recurrent problem. The problem occurs daily. The problem has not changed since onset.Pertinent negatives include no chest pain, no abdominal pain, no headaches and no shortness of breath. Nothing aggravates the symptoms. The symptoms are relieved by medications. She has tried nothing for the symptoms. The treatment provided no relief.    Past Medical History:  Diagnosis Date  . Medical history non-contributory     There are no active problems to display for this patient.   Past Surgical History:  Procedure Laterality Date  . TONSILLECTOMY     tonsils and adenoids  . WISDOM TOOTH EXTRACTION       OB History   No obstetric history on file.      Home Medications    Prior to Admission medications   Medication Sig Start Date End Date Taking? Authorizing Provider  hydrOXYzine (ATARAX/VISTARIL) 25 MG tablet Take 1 tablet (25 mg total) by mouth every 8 (eight) hours as needed. 10/21/19   Caccavale, Sophia, PA-C  Pseudoephedrine-APAP-DM (DAYQUIL PO) Take by mouth.    [provider]  silver sulfADIAZINE (SILVADENE) 1 % cream Apply 1 application topically daily. 01/17/19   Drenda Freeze, MD    Family History History reviewed. No pertinent family history.  Social History Social History   Tobacco Use  . Smoking status: Current Every Day Smoker    Packs/day: 0.50   Types: Cigarettes  . Smokeless tobacco: Never Used  Substance Use Topics  . Alcohol use: Yes    Comment: occasional  . Drug use: No     Allergies   Codeine, Hydrocodone, Ibuprofen, and Oxycodone   Review of Systems Review of Systems  Constitutional: Negative for fever.  Eyes: Negative for pain.  Respiratory: Negative for shortness of breath.   Cardiovascular: Negative for chest pain.  Gastrointestinal: Negative for abdominal pain.  Musculoskeletal: Negative for neck pain.  Skin: Negative for wound.  Neurological: Negative for headaches.     Physical Exam Updated Vital Signs BP 138/90 (BP Location: Left Arm)   Pulse 88   Temp 99.2 F (37.3 C) (Oral)   Resp 18   Ht 5\' 2"  (1.575 m)   Wt 76.7 kg   SpO2 100%   BMI 30.91 kg/m   Physical Exam Constitutional:      Appearance: She is well-developed.  HENT:     Head: Normocephalic and atraumatic.  Eyes:     Conjunctiva/sclera: Conjunctivae normal.  Neck:     Musculoskeletal: Neck supple.  Cardiovascular:     Rate and Rhythm: Normal rate and regular rhythm.     Pulses: Normal pulses.  Pulmonary:     Effort: Pulmonary effort is normal.     Breath sounds: Normal breath sounds.  Musculoskeletal:        General: No signs of injury.  Skin:  General: Skin is warm and dry.     Capillary Refill: Capillary refill takes less than 2 seconds.  Neurological:     General: No focal deficit present.     Mental Status: She is alert and oriented to person, place, and time.     GCS: GCS eye subscore is 4. GCS verbal subscore is 5. GCS motor subscore is 6.     Gait: Gait normal.      ED Treatments / Results  Labs (all labs ordered are listed, but only abnormal results are displayed) Labs Reviewed - No data to display  EKG None  Radiology No results found.  Procedures Procedures (including critical care time)  Medications Ordered in ED Medications  hydrOXYzine (ATARAX/VISTARIL) tablet 12.5 mg (has no  administration in time range)     Initial Impression / Assessment and Plan / ED Course  I have reviewed the triage vital signs and the nursing notes.  Pertinent labs & imaging results that were available during my care of the patient were reviewed by me and considered in my medical decision making (see chart for details).      23 year old female here with dizziness after being out of her Atarax for the last 2 days.  She says her anxiety has gotten higher.  We will give her a dose here and refill her prescription.  She has been having difficulty getting a primary care doctor due to changing jobs.  She understands to return if any worsening symptoms.  Final Clinical Impressions(s) / ED Diagnoses   Final diagnoses:  Anxiety  Dizziness    ED Discharge Orders         Ordered    hydrOXYzine (ATARAX/VISTARIL) 25 MG tablet  At bedtime PRN     11/17/19 2128           Terrilee Files, MD 11/18/19 [phone]

## 2019-11-17 NOTE — ED Triage Notes (Signed)
Pt reports that she ran out of anxiety meds 2 days ago. States that she got dizzy this afternoon and fell while going up the carpeted stairs. Reports that she hit her head when she fell, denies LOC.  Denies N/V, denies change in vision. Ambulatory without difficulty.

## 2019-11-17 NOTE — Discharge Instructions (Signed)
You were evaluated in the emergency department for dizziness and being out of one of your medications.  We are providing you a refill of this medication.  Please work on getting a primary care doctor and return to the emergency department if any worsening symptoms.

## 2019-11-17 NOTE — ED Notes (Signed)
ED Provider at bedside. 

## 2020-07-13 ENCOUNTER — Emergency Department (HOSPITAL_COMMUNITY)
Admission: EM | Admit: 2020-07-13 | Discharge: 2020-07-13 | Disposition: A | Discharge status: Home / Self Care | Payer: Medicaid Other | Attending: Emergency Medicine | Admitting: Emergency Medicine

## 2020-07-13 ENCOUNTER — Encounter (HOSPITAL_COMMUNITY): Payer: Self-pay | Admitting: Emergency Medicine

## 2020-07-13 ENCOUNTER — Emergency Department (HOSPITAL_COMMUNITY): Payer: Medicaid Other

## 2020-07-13 DIAGNOSIS — W19XXXA Unspecified fall, initial encounter: Secondary | ICD-10-CM | POA: Insufficient documentation

## 2020-07-13 DIAGNOSIS — F17210 Nicotine dependence, cigarettes, uncomplicated: Secondary | ICD-10-CM | POA: Insufficient documentation

## 2020-07-13 DIAGNOSIS — R519 Headache, unspecified: Secondary | ICD-10-CM

## 2020-07-13 DIAGNOSIS — S0990XA Unspecified injury of head, initial encounter: Secondary | ICD-10-CM

## 2020-07-13 DIAGNOSIS — Y9289 Other specified places as the place of occurrence of the external cause: Secondary | ICD-10-CM | POA: Insufficient documentation

## 2020-07-13 DIAGNOSIS — Y939 Activity, unspecified: Secondary | ICD-10-CM | POA: Insufficient documentation

## 2020-07-13 DIAGNOSIS — Y999 Unspecified external cause status: Secondary | ICD-10-CM | POA: Insufficient documentation

## 2020-07-13 LAB — POC URINE PREG, ED: Preg Test, Ur: NEGATIVE

## 2020-07-13 MED ORDER — SODIUM CHLORIDE 0.9 % IV BOLUS: 1000.0000 mL | Freq: Once | INTRAVENOUS | Status: DC

## 2020-07-13 MED ORDER — DIPHENHYDRAMINE HCL 50 MG/ML IJ SOLN: 25.0000 mg | Freq: Once | INTRAMUSCULAR | Status: DC

## 2020-07-13 MED ORDER — PROCHLORPERAZINE EDISYLATE 10 MG/2ML IJ SOLN: 10.0000 mg | Freq: Once | INTRAMUSCULAR | Status: DC

## 2020-07-13 MED ORDER — ACETAMINOPHEN 500 MG PO TABS
1000.0000 mg | ORAL_TABLET | Freq: Once | ORAL | Status: AC
  Administered 2020-07-13: 1000 mg via ORAL
  Filled 2020-07-13: qty 2

## 2020-07-13 MED ORDER — ONDANSETRON 4 MG PO TBDP: 4.0000 mg | ORAL_TABLET | Freq: Once | ORAL | Status: DC

## 2020-07-13 NOTE — ED Notes (Signed)
Discharge instructions reviewed with pt. Pt verbalized understanding.   

## 2020-07-13 NOTE — ED Provider Notes (Signed)
[facility] EMERGENCY DEPARTMENT Provider Note   CSN: [id number] Arrival date & time: 07/13/20  1741     History Chief Complaint  Patient presents with  . Fall  . Dizziness    Leighana Neyman is a 24 y.o. female.  HPI   Patient is a 24 year old female that presents to the emergency department due to a fall. Patient states that 2 days ago she was standing up and struck the right parietal region of her head on the bottom of a kitchen counter. She had continued pain in the region for about 2 days and today while at work she had an episode of lightheadedness and fell down and struck the same right parietal region on a barstool. She denies any LOC. Since the incident she reports mild tingling along the right side of the face. She denies chest pain, shortness of breath, nausea, vomiting, visual changes, numbness, dizziness.    Past Medical History:  Diagnosis Date  . Medical history non-contributory     There are no problems to display for this patient.   Past Surgical History:  Procedure Laterality Date  . TONSILLECTOMY     tonsils and adenoids  . WISDOM TOOTH EXTRACTION       OB History   No obstetric history on file.     No family history on file.  Social History   Tobacco Use  . Smoking status: Current Every Day Smoker    Packs/day: 0.50    Types: Cigarettes  . Smokeless tobacco: Never Used  Vaping Use  . Vaping Use: Never used  Substance Use Topics  . Alcohol use: Yes    Comment: occasional  . Drug use: No    Home Medications Prior to Admission medications   Medication Sig Start Date End Date Taking? Authorizing Provider  hydrOXYzine (ATARAX/VISTARIL) 25 MG tablet Take 0.5-1 tablets (12.5-25 mg total) by mouth at bedtime as needed. 11/17/19   Terrilee Files, MD  Pseudoephedrine-APAP-DM (DAYQUIL PO) Take by mouth.    [provider]  silver sulfADIAZINE (SILVADENE) 1 % cream Apply 1 application topically daily. 01/17/19   Charlynne Pander, MD    Allergies    Codeine, Hydrocodone, Ibuprofen, and Oxycodone  Review of Systems   Review of Systems  All other systems reviewed and are negative. Ten systems reviewed and are negative for acute change, except as noted in the HPI.   Physical Exam Updated Vital Signs BP 119/76 (BP Location: Right Arm)   Temp 98.5 F (36.9 C) (Oral)   Resp 18   Ht 5\' 2"  (1.575 m)   Wt 76.7 kg   SpO2 99%   BMI 30.93 kg/m   Physical Exam Vitals and nursing note reviewed.  Constitutional:      General: She is not in acute distress.    Appearance: Normal appearance. She is not ill-appearing, toxic-appearing or diaphoretic.  HENT:     Head: Normocephalic.     Comments: Moderate TTP noted diffusely along the right parietal region of the scalp. No obvious ecchymosis or hematoma. No crepitus.    Right Ear: Tympanic membrane, ear canal and external ear normal.     Left Ear: Tympanic membrane, ear canal and external ear normal.     Nose: Nose normal.     Mouth/Throat:     Mouth: Mucous membranes are moist.     Pharynx: Oropharynx is clear. No oropharyngeal exudate or posterior oropharyngeal erythema.  Eyes:     General: No scleral icterus.  Right eye: No discharge.        Left eye: No discharge.     Extraocular Movements: Extraocular movements intact.     Conjunctiva/sclera: Conjunctivae normal.     Pupils: Pupils are equal, round, and reactive to light.  Cardiovascular:     Rate and Rhythm: Normal rate and regular rhythm.     Pulses: Normal pulses.     Heart sounds: Normal heart sounds. No murmur heard.  No friction rub. No gallop.   Pulmonary:     Effort: Pulmonary effort is normal. No respiratory distress.     Breath sounds: Normal breath sounds. No stridor. No wheezing, rhonchi or rales.  Abdominal:     General: Abdomen is flat.     Palpations: Abdomen is soft.     Tenderness: There is no abdominal tenderness.  Musculoskeletal:        General: Normal range of  motion.     Cervical back: Normal range of motion and neck supple. No tenderness.  Skin:    General: Skin is warm and dry.  Neurological:     General: No focal deficit present.     Mental Status: She is alert and oriented to person, place, and time.     Comments: Patient is oriented to person, place, and time. Patient phonates in clear, complete, and coherent sentences. Negative arm drift. Finger to nose intact bilaterally with no visible signs of dysmetria. Strength is 5/5 in all four extremities. Distal sensation intact in all four extremities.  Patient is able to ambulate with steady gait.  Psychiatric:        Mood and Affect: Mood normal.        Behavior: Behavior normal.    ED Results / Procedures / Treatments   Labs (all labs ordered are listed, but only abnormal results are displayed) Labs Reviewed  BASIC METABOLIC PANEL  CBC WITH DIFFERENTIAL/PLATELET  POC URINE PREG, ED   EKG EKG Interpretation  Date/Time:  Wednesday July 13 2020 18:05:53 EDT Ventricular Rate:  73 PR Interval:  152 QRS Duration: 72 QT Interval:  372 QTC Calculation: 409 R Axis:   50 Text Interpretation: Normal sinus rhythm Normal ECG No significant change since last tracing Confirmed by Melene Plan [phone]) on 07/13/2020 9:52:20 PM   Radiology CT Head Wo Contrast  Result Date: 07/13/2020 CLINICAL DATA:  Larey Seat, dizziness, hit head, headache EXAM: CT HEAD WITHOUT CONTRAST TECHNIQUE: Contiguous axial images were obtained from the base of the skull through the vertex without intravenous contrast. COMPARISON:  None. FINDINGS: Brain: No acute infarct or hemorrhage. Lateral ventricles and midline structures are unremarkable. No acute extra-axial fluid collections. No mass effect. Vascular: No hyperdense vessel or unexpected calcification. Skull: Normal. Negative for fracture or focal lesion. Sinuses/Orbits: No acute finding. Other: None. IMPRESSION: No acute intracranial process. Electronically Signed   By: Sharlet Salina M.D.   On: 07/13/2020 18:44   Procedures Procedures (including critical care time)  Medications Ordered in ED Medications  ondansetron (ZOFRAN-ODT) disintegrating tablet 4 mg (4 mg Oral Not Given 07/13/20 2143)  prochlorperazine (COMPAZINE) injection 10 mg (10 mg Intravenous Refused 07/13/20 2154)  diphenhydrAMINE (BENADRYL) injection 25 mg (25 mg Intravenous Refused 07/13/20 2154)  sodium chloride 0.9 % bolus 1,000 mL (1,000 mLs Intravenous Refused 07/13/20 2154)  acetaminophen (TYLENOL) tablet 1,000 mg (1,000 mg Oral Given 07/13/20 2207)   ED Course  I have reviewed the triage vital signs and the nursing notes.  Pertinent labs & imaging results that were available  during my care of the patient were reviewed by me and considered in my medical decision making (see chart for details).    MDM Rules/Calculators/A&P                          Pt is a 24 y.o. female that present with a history, physical exam, ED Clinical Course as noted above.   Patient appears today with right-sided headache status post trauma to the right side of the head.  Physical exam is reassuring.  Neurological exam is benign.  CT of the head was negative.  Believe she is also experiencing symptoms consistent with postconcussive syndrome.  Offered patient a migraine cocktail in the emergency department as well as basic labs which she was amenable to but ultimately decided to decline.  We will give patient a gram of Tylenol and will discharge at this time.  She was given strict return precautions and knows she needs to return to the emergency department any new or worsening symptoms.  Her questions were answered and she was amicable to time of discharge.  Patient discharged to home/self care.  Condition at discharge: Stable  Note: Portions of this report may have been transcribed using voice recognition software. Every effort was made to ensure accuracy; however, inadvertent computerized transcription errors may be  present.   Final Clinical Impression(s) / ED Diagnoses Final diagnoses:  Fall, initial encounter  Injury of head, initial encounter  Bad headache   Rx / DC Orders ED Discharge Orders    None       Placido Sou, PA-C 07/13/20 2209    Melene Plan, DO 07/13/20 2333

## 2020-07-13 NOTE — ED Notes (Signed)
RN explained to pt blood work and IV medications ordered. Pt verbalized understanding. RN returned with medications and pt stated she wanted her IV removed and wanted to go home without medications. RN notified provider.

## 2020-07-13 NOTE — ED Triage Notes (Signed)
Patient in POV. Reports 2 episodes of falling and dizziness over the past 2 days. Hit side of head. Denies LOC.

## 2020-07-13 NOTE — ED Provider Notes (Signed)
Patient placed in Quick Look pathway, seen and evaluated   Chief Complaint: head injury  HPI:   Stood up and hit right side of head on a shelf. Intermittent dizziness and nausea since then. Today, became dizzy at work, lost balance, fell and hit right side of head again.  Denies anticoagulation. Denies syncope, neck pain, back pain, vomiting, seizure, confusion.  ROS: Head injury (one)  Physical Exam:   Gen: No distress  Neuro: Awake and Alert  Skin: Warm    Focused Exam:   Tenderness to right side of head, no swelling, deformity, wounds. Cranial nerves III-XII grossly intact. Strength 5/5 in extremities. No noted ataxia. A&Ox4 No tenderness to neck/back.   Initiation of care has begun. The patient has been counseled on the process, plan, and necessity for staying for the completion/evaluation, and the remainder of the medical screening examination   Concepcion Living 07/13/20 1817    Melene Plan, DO 07/13/20 1826

## 2020-07-13 NOTE — Discharge Instructions (Signed)
I recommend a combination of tylenol and ibuprofen for management of your pain. You can take a low dose of both at the same time. I recommend 325 mg of Tylenol combined with 400 mg of ibuprofen. This is one regular Tylenol and two regular ibuprofen. You can take these 2-3 times for day for your pain. Please try to take these medications with a small amount of food as well to prevent upsetting your stomach.  Please return to the emergency department if you develop any new or worsening symptoms.  It was a pleasure to meet you.

## 2022-01-28 ENCOUNTER — Encounter (HOSPITAL_BASED_OUTPATIENT_CLINIC_OR_DEPARTMENT_OTHER): Payer: Self-pay | Admitting: Emergency Medicine

## 2022-01-28 ENCOUNTER — Emergency Department (HOSPITAL_BASED_OUTPATIENT_CLINIC_OR_DEPARTMENT_OTHER): Payer: Worker's Compensation

## 2022-01-28 ENCOUNTER — Emergency Department (HOSPITAL_BASED_OUTPATIENT_CLINIC_OR_DEPARTMENT_OTHER)
Admission: EM | Admit: 2022-01-28 | Discharge: 2022-01-28 | Disposition: A | Discharge status: Home / Self Care | Payer: Worker's Compensation | Attending: Emergency Medicine | Admitting: Emergency Medicine

## 2022-01-28 ENCOUNTER — Other Ambulatory Visit: Payer: Self-pay

## 2022-01-28 DIAGNOSIS — X509XXA Other and unspecified overexertion or strenuous movements or postures, initial encounter: Secondary | ICD-10-CM | POA: Insufficient documentation

## 2022-01-28 DIAGNOSIS — M25571 Pain in right ankle and joints of right foot: Secondary | ICD-10-CM | POA: Diagnosis present

## 2022-01-28 DIAGNOSIS — G8911 Acute pain due to trauma: Secondary | ICD-10-CM | POA: Diagnosis not present

## 2022-01-28 MED ORDER — ACETAMINOPHEN 325 MG PO TABS
650.0000 mg | ORAL_TABLET | Freq: Once | ORAL | Status: AC
  Administered 2022-01-28: 650 mg via ORAL
  Filled 2022-01-28: qty 2

## 2022-01-28 MED ORDER — LIDOCAINE 5 % EX PTCH
1.0000 | MEDICATED_PATCH | CUTANEOUS | 0 refills | Status: AC
Start: 2022-01-28 — End: ?

## 2022-01-28 NOTE — Discharge Instructions (Signed)
Your x-ray today did not show any evidence of broken or dislocated bones.  You likely sprained or strained her ankle.  We have placed you in a brace.  Make sure to stay off of this, keep elevated, Tylenol, Motrin and ice for pain management.  Follow with orthopedics if you are still having significant pain and unable to walk on your ankle after about 4 days.  Return for new worsening symptoms

## 2022-01-28 NOTE — ED Provider Notes (Signed)
[facility] EMERGENCY DEPARTMENT Provider Note   CSN: [id number] Arrival date & time: 01/28/22  1433     History  Chief Complaint  Patient presents with   Ankle Injury    Heidi David is a 26 y.o. female here for evaluation of right ankle pain after inverting ankle PTA.  Has some swelling and pain to her right lateral malleolus and foot.  No pain to femur, knee, proximal, midshaft tib-fib.  No paresthesias or weakness. Unable to ambulate secondary to pain. No lacerations or breaks in skin  HPI     Home Medications Prior to Admission medications   Medication Sig Start Date End Date Taking? Authorizing Provider  lidocaine (LIDODERM) 5 % Place 1 patch onto the skin daily. Remove & Discard patch within 12 hours or as directed by MD 01/28/22  Yes Kaydance Bowie A, PA-C  hydrOXYzine (ATARAX/VISTARIL) 25 MG tablet Take 0.5-1 tablets (12.5-25 mg total) by mouth at bedtime as needed. Patient taking differently: Take 12.5 mg by mouth at bedtime as needed for anxiety.  11/17/19   Terrilee Files, MD  Pseudoephedrine-APAP-DM (DAYQUIL PO) Take by mouth.    [provider]  silver sulfADIAZINE (SILVADENE) 1 % cream Apply 1 application topically daily. 01/17/19   Charlynne Pander, MD      Allergies    Codeine, Hydrocodone, Ibuprofen, and Oxycodone    Review of Systems   Review of Systems  Constitutional: Negative.   HENT: Negative.    Respiratory: Negative.    Cardiovascular: Negative.   Gastrointestinal: Negative.   Genitourinary: Negative.   Musculoskeletal:        Right ankle and foot pain  Skin: Negative.   Neurological: Negative.   All other systems reviewed and are negative.  Physical Exam Updated Vital Signs BP 134/79    Pulse 80    Temp 98.4 F (36.9 C) (Oral)    Resp 18    Ht 5\' 2"  (1.575 m)    Wt 92.1 kg    LMP 01/18/2022    SpO2 93%    BMI 37.13 kg/m  Physical Exam Vitals and nursing note reviewed.  Constitutional:      General: She is not in  acute distress.    Appearance: She is well-developed. She is not ill-appearing, toxic-appearing or diaphoretic.  HENT:     Head: Atraumatic.  Eyes:     Pupils: Pupils are equal, round, and reactive to light.  Cardiovascular:     Rate and Rhythm: Normal rate.     Pulses: Normal pulses.          Dorsalis pedis pulses are 2+ on the right side.       Posterior tibial pulses are 2+ on the right side.     Heart sounds: Normal heart sounds.  Pulmonary:     Effort: No respiratory distress.  Abdominal:     General: There is no distension.  Musculoskeletal:        General: Normal range of motion.     Cervical back: Normal range of motion.     Comments: Diffuse tenderness right lateral malleolus, right ATFL.  Able to plantarflex, dorsiflex however with pain.  We will states that difficulty.  Nontender bilateral femur, knee, proximal, midshaft tib-fib.  Skin:    General: Skin is warm and dry.     Capillary Refill: Capillary refill takes less than 2 seconds.     Comments: Soft tissue swelling right ankle, no erythema, warmth, pallor  Neurological:  General: No focal deficit present.     Mental Status: She is alert.     Cranial Nerves: Cranial nerves 2-12 are intact.     Sensory: Sensation is intact.     Motor: Motor function is intact.     Comments: Intact sensation  Psychiatric:        Mood and Affect: Mood normal.   ED Results / Procedures / Treatments   Labs (all labs ordered are listed, but only abnormal results are displayed) Labs Reviewed - No data to display  EKG None  Radiology DG Ankle Complete Right  Result Date: 01/28/2022 CLINICAL DATA:  Lateral ankle pain and swelling after fall. EXAM: RIGHT ANKLE - COMPLETE 3+ VIEW COMPARISON:  None. FINDINGS: There is no evidence of fracture or dislocation. Small joint effusion. There is no evidence of arthropathy or other focal bone abnormality. Soft tissues are unremarkable. IMPRESSION: No fracture or dislocation of the right  ankle. Small joint effusion. Electronically Signed   By: Maudry MayhewJeffrey  Waltz M.D.   On: 01/28/2022 16:10   DG Foot Complete Right  Result Date: 01/28/2022 CLINICAL DATA:  Lateral foot/ankle pain and swelling after fall. EXAM: RIGHT FOOT COMPLETE - 3+ VIEW COMPARISON:  None. FINDINGS: There is no evidence of fracture or dislocation. There is no evidence of arthropathy or other focal bone abnormality. Soft tissues are unremarkable. IMPRESSION: No acute osseous abnormality. Electronically Signed   By: Maudry MayhewJeffrey  Waltz M.D.   On: 01/28/2022 16:08    Procedures .Ortho Injury Treatment  Date/Time: 01/28/2022 4:36 PM Performed by: Linwood DibblesHenderly, Jnya Brossard A, PA-C Authorized by: Linwood DibblesHenderly, Briane Birden A, PA-C   Consent:    Consent obtained:  Verbal   Consent given by:  Patient   Risks discussed:  Fracture, nerve damage, recurrent dislocation, irreducible dislocation, stiffness, restricted joint movement and vascular damage   Alternatives discussed:  No treatment, alternative treatment, immobilization, referral and delayed treatmentInjury location: ankle Location details: right ankle Injury type: soft tissue Pre-procedure neurovascular assessment: neurovascularly intact Pre-procedure distal perfusion: normal Pre-procedure neurological function: normal Pre-procedure range of motion: normal  Anesthesia: Local anesthesia used: no  Patient sedated: NoImmobilization: splint and crutches Splint Applied by: ED Tech Post-procedure neurovascular assessment: post-procedure neurovascularly intact Post-procedure distal perfusion: normal Post-procedure neurological function: normal Post-procedure range of motion: normal      Medications Ordered in ED Medications  acetaminophen (TYLENOL) tablet 650 mg (650 mg Oral Given 01/28/22 1542)    ED Course/ Medical Decision Making/ A&P    26 year old here for evaluation mechanical fall just PTA.  Denies hitting head, LOC.  Pain to right ankle with overlying soft tissue  swelling.  Neurovascularly intact.  No proximal tib-fib, femur pain.  Compartments soft.  Imaging personally reviewed and interpreted Soft tissue swelling without fracture, dislocation or effusion  Discussed results with patient.  Suspect sprain or strain.  Placed in Aircast, crutches, will have her follow-up outpatient with orthopedics, RICE, symptomatic management.  She is agreeable.  The patient has been appropriately medically screened and/or stabilized in the ED. I have low suspicion for any other emergent medical condition which would require further screening, evaluation or treatment in the ED or require inpatient management.  Patient is hemodynamically stable and in no acute distress.  Patient able to ambulate in department prior to ED.  Evaluation does not show acute pathology that would require ongoing or additional emergent interventions while in the emergency department or further inpatient treatment.  I have discussed the diagnosis with the patient and answered all questions.  Pain is been managed while in the emergency department and patient has no further complaints prior to discharge.  Patient is comfortable with plan discussed in room and is stable for discharge at this time.  I have discussed strict return precautions for returning to the emergency department.  Patient was encouraged to follow-up with PCP/specialist refer to at discharge.                           Medical Decision Making Amount and/or Complexity of Data Reviewed Independent Historian: friend External Data Reviewed: labs, radiology and notes. Radiology: ordered and independent interpretation performed. Decision-making details documented in ED Course.  Risk OTC drugs. Prescription drug management. Risk Details: Do not feel patient needs additional labs, imaging, hospitalization at this time, Will have FU with orthopedics           Final Clinical Impression(s) / ED Diagnoses Final diagnoses:  Acute  right ankle pain    Rx / DC Orders ED Discharge Orders          Ordered    lidocaine (LIDODERM) 5 %  Every 24 hours        01/28/22 1638              [place] Shannahan A, PA-C 01/28/22 1639    Cathren Laine, MD 01/28/22 [phone]

## 2022-01-28 NOTE — ED Triage Notes (Signed)
Pt arrives pov, to triage in wheelchair, c/o right ankle injury pta. Pt denies fall, c/o lateral pain and swelling

## 2022-01-29 ENCOUNTER — Ambulatory Visit (INDEPENDENT_AMBULATORY_CARE_PROVIDER_SITE_OTHER): Payer: Worker's Compensation | Admitting: Family Medicine

## 2022-01-29 ENCOUNTER — Ambulatory Visit: Payer: Self-pay

## 2022-01-29 VITALS — BP 110/76 | Ht 62.0 in | Wt 203.0 lb

## 2022-01-29 DIAGNOSIS — M25571 Pain in right ankle and joints of right foot: Secondary | ICD-10-CM

## 2022-01-29 DIAGNOSIS — S92114A Nondisplaced fracture of neck of right talus, initial encounter for closed fracture: Secondary | ICD-10-CM

## 2022-01-29 DIAGNOSIS — S9031XA Contusion of right foot, initial encounter: Secondary | ICD-10-CM | POA: Insufficient documentation

## 2022-01-29 NOTE — Patient Instructions (Addendum)
Nice to meet you Please try the boot  Please try the scooter   Please send me a message in [place] with any questions or updates.  We'll schedule a virtual visit once the MRI is resulted.   --Dr. Raeford Razor  Closed nondisplaced fracture of neck of right talus, initial encounter

## 2022-01-29 NOTE — Progress Notes (Signed)
°  Heidi David - 26 y.o. female MRN [id number]  Date of birth: 1996/09/19  SUBJECTIVE:  Including CC & ROS.  No chief complaint on file.   Heidi David is a 26 y.o. female that is presenting with right foot pain following an injury at work.  She had inversion injury she is experiencing severe pain.  Unable to bear weight.  Has been using crutches.  No history of similar pain. Reviewed the note from 1/29 shows she was provided an Aircast with crutches. Independent review of the right foot x-ray from 1/29 shows no acute changes. Of the right ankle x-ray from 1/29 shows joint effusion  Review of Systems See HPI   HISTORY: Past Medical, Surgical, Social, and Family History Reviewed & Updated per EMR.   Pertinent Historical Findings include:  Past Medical History:  Diagnosis Date   Medical history non-contributory     Past Surgical History:  Procedure Laterality Date   TONSILLECTOMY     tonsils and adenoids   WISDOM TOOTH EXTRACTION       PHYSICAL EXAM:  VS: BP 110/76    Ht 5\' 2"  (1.575 m)    Wt 203 lb (92.1 kg)    LMP 01/18/2022    BMI 37.13 kg/m  Physical Exam Gen: NAD, alert, cooperative with exam, well-appearing MSK:  Neurovascularly intact    Limited ultrasound: Right foot and ankle:  No effusion within the ankle joint.   There is an effusion extending from the dorsum of the talus as well as hyperemia to indicate a fracture. Normal-appearing peroneal tendons. Normal base of the fifth metatarsal.  Summary: Findings indicate tarsal neck fracture.  Ultrasound and interpretation by 01/20/2022, MD    ASSESSMENT & PLAN:   Closed nondisplaced fracture of neck of right talus Initial injury occurred on 1/21 while at work.  Changes consistent with fracture of the talus.  Unclear if this is a body or neck fracture. -Counseled on home exercise therapy and supportive care. -Cam walker. -MRI of the right ankle to evaluate for talus neck fracture

## 2022-01-29 NOTE — Assessment & Plan Note (Signed)
Initial injury occurred on 1/21 while at work.  Changes consistent with fracture of the talus.  Unclear if this is a body or neck fracture. -Counseled on home exercise therapy and supportive care. -Cam walker. -MRI of the right ankle to evaluate for talus neck fracture

## 2022-01-30 NOTE — Addendum Note (Signed)
Addended by: Merrilyn Puma on: 01/30/2022 05:09 PM   Modules accepted: Orders

## 2022-02-03 ENCOUNTER — Encounter: Payer: Self-pay | Admitting: Family Medicine

## 2022-02-06 ENCOUNTER — Telehealth: Payer: Self-pay | Admitting: Family Medicine

## 2022-02-06 NOTE — Addendum Note (Signed)
Addended by: Merrilyn Puma on: 02/06/2022 04:31 PM   Modules accepted: Orders

## 2022-02-13 ENCOUNTER — Ambulatory Visit (INDEPENDENT_AMBULATORY_CARE_PROVIDER_SITE_OTHER): Payer: Worker's Compensation | Admitting: Family Medicine

## 2022-02-13 ENCOUNTER — Encounter: Payer: Self-pay | Admitting: Family Medicine

## 2022-02-13 VITALS — BP 118/80 | Ht 62.0 in | Wt 203.0 lb

## 2022-02-13 DIAGNOSIS — S92114D Nondisplaced fracture of neck of right talus, subsequent encounter for fracture with routine healing: Secondary | ICD-10-CM

## 2022-02-13 MED ORDER — MELOXICAM 7.5 MG PO TABS: 7.5000 mg | ORAL_TABLET | Freq: Every day | ORAL | 1 refills | Status: AC | PRN

## 2022-02-13 NOTE — Patient Instructions (Signed)
Good to see you Please try the mobic. Do note take with ibuprofen   Please send me a message in MyChart with any questions or updates.  We'll set up a virtual visit once the MRi is resulted.   --Dr. Jordan Likes

## 2022-02-13 NOTE — Assessment & Plan Note (Signed)
Acutely occurring.  Still having pain that is ongoing.  Having MRI completed this weekend. -Counseled on home exercise therapy and supportive care. -Counseled on CAM Walker and rolling knee scooter. -Mobic.

## 2022-02-13 NOTE — Progress Notes (Signed)
°  Heidi David - 26 y.o. female MRN [id number]  Date of birth: 22-Aug-1996  SUBJECTIVE:  Including CC & ROS.  No chief complaint on file.   Heidi David is a 26 y.o. female that is presenting with acute worsening of her right foot and ankle pain.  She is getting her MRI completed this weekend.  Having pain that extends across the midfoot.  Continues to wear the cam walker and using a rolling knee scooter.   Review of Systems See HPI   HISTORY: Past Medical, Surgical, Social, and Family History Reviewed & Updated per EMR.   Pertinent Historical Findings include:  Past Medical History:  Diagnosis Date   Medical history non-contributory     Past Surgical History:  Procedure Laterality Date   TONSILLECTOMY     tonsils and adenoids   WISDOM TOOTH EXTRACTION       PHYSICAL EXAM:  VS: BP 118/80 (BP Location: Left Arm, Patient Position: Sitting)    Ht 5\' 2"  (1.575 m)    Wt 203 lb (92.1 kg)    LMP 01/18/2022    BMI 37.13 kg/m  Physical Exam Gen: NAD, alert, cooperative with exam, well-appearing MSK:  Neurovascularly intact       ASSESSMENT & PLAN:   Closed nondisplaced fracture of neck of right talus Acutely occurring.  Still having pain that is ongoing.  Having MRI completed this weekend. -Counseled on home exercise therapy and supportive care. -Counseled on CAM Walker and rolling knee scooter. -Mobic.

## 2022-02-17 ENCOUNTER — Other Ambulatory Visit: Payer: Self-pay

## 2022-02-17 ENCOUNTER — Ambulatory Visit (HOSPITAL_BASED_OUTPATIENT_CLINIC_OR_DEPARTMENT_OTHER)
Admission: RE | Admit: 2022-02-17 | Discharge: 2022-02-17 | Disposition: A | Discharge status: Home / Self Care | Payer: Worker's Compensation | Source: Ambulatory Visit | Attending: Family Medicine | Admitting: Family Medicine

## 2022-02-17 DIAGNOSIS — S92114A Nondisplaced fracture of neck of right talus, initial encounter for closed fracture: Secondary | ICD-10-CM | POA: Diagnosis present

## 2022-02-19 ENCOUNTER — Encounter: Payer: Self-pay | Admitting: Family Medicine

## 2022-02-19 ENCOUNTER — Ambulatory Visit (INDEPENDENT_AMBULATORY_CARE_PROVIDER_SITE_OTHER): Payer: Worker's Compensation | Admitting: Family Medicine

## 2022-02-19 VITALS — BP 120/86 | Ht 62.0 in | Wt 203.0 lb

## 2022-02-19 DIAGNOSIS — S9031XD Contusion of right foot, subsequent encounter: Secondary | ICD-10-CM

## 2022-02-19 DIAGNOSIS — Q6689 Other  specified congenital deformities of feet: Secondary | ICD-10-CM | POA: Insufficient documentation

## 2022-02-19 NOTE — Progress Notes (Signed)
°  Heidi David - 26 y.o. female MRN [id number]  Date of birth: [birth year]  SUBJECTIVE:  Including CC & ROS.  No chief complaint on file.   Heidi David is a 26 y.o. female that is following up after the MRI of her right foot.  This was demonstrating suggestion of the coalition as well as subchondral marrow edema of the lateral talus.    Review of Systems See HPI   HISTORY: Past Medical, Surgical, Social, and Family History Reviewed & Updated per EMR.   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  Contusion of foot, right Has marrow changes following her accident that occurred at work. -Counseled on home exercise therapy and supportive care. -Referral to physical therapy. -Counseled on weaning out of the cam walker.

## 2022-02-19 NOTE — Patient Instructions (Signed)
Good to see you Please use ice as needed  Please wean out of the boot  Please try physical therapy   Please send me a message in MyChart with any questions or updates.  Please see me back in 4-6 weeks.   --Dr. Jordan Likes

## 2022-02-19 NOTE — Assessment & Plan Note (Signed)
Has marrow changes following her accident that occurred at work. -Counseled on home exercise therapy and supportive care. -Referral to physical therapy. -Counseled on weaning out of the cam walker.

## 2022-02-19 NOTE — Assessment & Plan Note (Signed)
Has findings on MRI to suggest correlation.

## 2022-02-20 ENCOUNTER — Telehealth: Payer: Medicaid Other | Admitting: Family Medicine

## 2022-02-22 ENCOUNTER — Encounter: Payer: Self-pay | Admitting: Family Medicine

## 2022-02-22 ENCOUNTER — Other Ambulatory Visit: Payer: Self-pay

## 2022-02-22 ENCOUNTER — Ambulatory Visit: Payer: Worker's Compensation | Attending: Family Medicine | Admitting: Physical Therapy

## 2022-02-22 ENCOUNTER — Encounter: Payer: Self-pay | Admitting: Physical Therapy

## 2022-02-22 DIAGNOSIS — Q6689 Other  specified congenital deformities of feet: Secondary | ICD-10-CM | POA: Insufficient documentation

## 2022-02-22 DIAGNOSIS — S9031XD Contusion of right foot, subsequent encounter: Secondary | ICD-10-CM | POA: Diagnosis not present

## 2022-02-22 DIAGNOSIS — R2689 Other abnormalities of gait and mobility: Secondary | ICD-10-CM | POA: Insufficient documentation

## 2022-02-22 DIAGNOSIS — R600 Localized edema: Secondary | ICD-10-CM | POA: Diagnosis present

## 2022-02-22 DIAGNOSIS — M25571 Pain in right ankle and joints of right foot: Secondary | ICD-10-CM | POA: Diagnosis not present

## 2022-02-22 DIAGNOSIS — M6281 Muscle weakness (generalized): Secondary | ICD-10-CM | POA: Insufficient documentation

## 2022-02-22 DIAGNOSIS — M25671 Stiffness of right ankle, not elsewhere classified: Secondary | ICD-10-CM | POA: Insufficient documentation

## 2022-02-22 NOTE — Patient Instructions (Signed)
Access Code: [email or web address] URL: [email or web address] Date: 02/22/2022 Prepared by: Raynelle Fanning  Exercises Seated Ankle Circles - 2 x daily - 7 x weekly - 2 sets - 10 reps Seated Ankle Alphabet - 2 x daily - 7 x weekly - 1 sets - 1 reps Seated Ankle Inversion Eversion AROM - 2 x daily - 7 x weekly - 2 sets - 10 reps Standing Heel Raises - 1 x daily - 4 x weekly - 2 sets - 10 reps

## 2022-02-22 NOTE — Therapy (Signed)
[facility] [address]  [place], [place], [address] Phone: [phone]   Fax:  [phone]  Physical Therapy Evaluation  Patient Details  Name: Heidi David MRN: [id number] Date of Birth: 1996/10/12 Referring Provider (PT): Clearance Coots   Encounter Date: 02/22/2022   PT End of Session - 02/22/22 1020     Visit Number 1    Date for PT Re-Evaluation 04/05/22    Authorization Type self pay    PT Start Time 1021    PT Stop Time 1101    PT Time Calculation (min) 40 min    Activity Tolerance Patient tolerated treatment well    Behavior During Therapy [facility] for tasks assessed/performed             Past Medical History:  Diagnosis Date   Medical history non-contributory     Past Surgical History:  Procedure Laterality Date   TONSILLECTOMY     tonsils and adenoids   WISDOM TOOTH EXTRACTION      There were no vitals filed for this visit.    Subjective Assessment - 02/22/22 1027     Subjective 01/28/22 pt stepped on a w/c ramp and had inversion sprain of right ankle. In boot and scooter until Monday. No pain with rest. When she WB or inverts the ankle she has pain. She feels popping also sometimes.    Pertinent History tarsal coalition    How long can you stand comfortably? 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 Mild subcortical reactive marrow edema in the adjacent  plantar lateral aspect of the anterior talus.    Patient Stated Goals make it stop hurting and to walk normally; get back to work    Currently in Pain? Yes    Pain Score 3    6/10 at worse   Pain Location Ankle    Pain Orientation Right;Lateral;Medial    Pain Descriptors / Indicators Dull;Throbbing;Pressure    Pain Type Acute pain     Pain Radiating Towards into lower leg at end of day    Pain Onset 1 to 4 weeks ago    Pain Frequency Intermittent    Aggravating Factors  weight bearing    Pain Relieving Factors rest    Effect of Pain on Daily Activities can't work; waking uncomfortable                [facility] PT Assessment - 02/22/22 0001       Assessment   Medical Diagnosis tarsal coalition of R foot; contusion of R foot    Referring Provider (PT) Clearance Coots    Onset Date/Surgical Date 01/28/22    Hand Dominance Right    Next MD Visit 04/05/22      Precautions   Precautions None      Restrictions   Weight Bearing Restrictions No      Balance Screen   Has the patient fallen in the past 6 months No    Has the patient had a decrease in activity level because of a fear of falling?  No    Is the patient reluctant to leave their home because of a fear of falling?  No      Home Environment   Living Environment Private residence    Additional Comments step to gait  on steps in garage 3 steps      Prior Function   Level of Independence Independent    Architect;Full time employment    Vocation Requirements EMT and CNA    Leisure would like to get back to working out      Observation/Other Assessments   Focus on Therapeutic Outcomes (FOTO)  47 (predicted 73)      Observation/Other Assessments-Edema    Edema Figure 8      Figure 8 Edema   Figure 8 - Right  50.5 cm    Figure 8 - Left  49 cm      Posture/Postural Control   Posture Comments stands with decreased WB through R LE and right hip in ER      ROM / Strength   AROM / PROM / Strength AROM;PROM;Strength      AROM   AROM Assessment Site Ankle    Right/Left Ankle Right    Right Ankle Dorsiflexion -10    Right Ankle Plantar Flexion 50    Right Ankle Inversion 25    Right Ankle Eversion 10      PROM   Overall PROM Comments pain at end range DF and PF    PROM Assessment Site Ankle    Right/Left Ankle Right    Right Ankle  Dorsiflexion 9    Right Ankle Plantar Flexion 57    Right Ankle Inversion 35    Right Ankle Eversion 40      Strength   Strength Assessment Site Ankle    Right/Left Ankle Right    Right Ankle Dorsiflexion 5/5    Right Ankle Plantar Flexion 4+/5    Right Ankle Inversion 5/5    Right Ankle Eversion 4+/5      Palpation   Palpation comment mild tenderness at calcanealfib ligament      Special Tests   Other special tests R SLS = 20 sec      Ambulation/Gait   Ambulation/Gait Yes    Ambulation/Gait Assistance 7: Independent    Ambulation Distance (Feet) 40 Feet    Assistive device None    Gait Pattern Step-through pattern;Decreased stance time - right;Decreased step length - left;Decreased hip/knee flexion - right;Decreased dorsiflexion - right;Decreased weight shift to right;Antalgic    Ambulation Surface Level    Pre-Gait Activities weight shifting, exaggerated step length; heel strike    Gait Comments improved gait after pre gait activities, but still painful                        Objective measurements completed on examination: See above findings.                PT Education - 02/22/22 1429     Education Details HEP; POC discussed; gait: wt shifts, exaggerated step length and heel strike emphasized    Person(s) Educated Patient    Methods Explanation;Demonstration;Handout    Comprehension Verbalized understanding;Returned demonstration              PT Short Term Goals - 02/22/22 1438       PT SHORT TERM GOAL #1   Title Ind with initial HEP    Time 2    Period Weeks    Status New    Target Date 03/08/22      PT SHORT TERM GOAL #2   Title Patient to ambulate with a normal gait pattern without increased pain in the R ankle    Time 3  Period Weeks    Target Date 03/15/22               PT Long Term Goals - 02/22/22 1439       PT LONG TERM GOAL #1   Title Ind with advanced HEP    Time 6    Period Weeks    Status New     Target Date 04/05/22      PT LONG TERM GOAL #2   Title Patient to demonstrate R ankle DF to >= 8 degrees to normalize her gait and stairs    Time 6    Period Weeks    Status New      PT LONG TERM GOAL #3   Title Pt able to climb stairs with a reciprocal gait pattern    Time 6    Period Weeks    Status New      PT LONG TERM GOAL #4   Title Pt to demonstrate R ankle ROM to [facility] and be able to peform standing ADLs without pain >= 90% of the time.    Time 6    Period Weeks    Status New      PT LONG TERM GOAL #5   Title Improved FOTO to 73 from 47 at intake showing functional improvement    Time 6    Period Weeks    Status New                    Plan - 02/22/22 1023     Clinical Impression Statement Patient presents with c/o right ankle stiffness and pain following an inversion ankle sprain on 01/28/22. She was just d/c'd from a CAM boot and scooter on 02/19/22.  She now reports pain with weightbearing activities including walking, standing and stairs. She reports a step to gait on stairs as well. She amb with an antalgic gait and abnormal gait patter with ER R hip, decreased heel strike and decreased stance time on the R. She has limitations in ROM and strength. 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 She is also in school full time for phlebotomy as well. She also has edema compared to the left ankle. She will benefit from skilled PT to address these deficits.    Examination-Activity Limitations Locomotion Level;Stand;Stairs    Examination-Participation Restrictions Occupation;Community Activity    Stability/Clinical Decision Making Stable/Uncomplicated    Clinical Decision Making Low    Rehab Potential Excellent    PT Frequency 2x / week    PT Duration 6 weeks    PT Treatment/Interventions ADLs/Self Care Home Management;Cryotherapy;Electrical Stimulation;Iontophoresis 4mg /ml Dexamethasone;Moist  Heat;Neuromuscular re-education;Balance training;Therapeutic exercise;Therapeutic activities;Gait training;Stair training;Patient/family education;Manual techniques;Dry needling;Passive range of motion;Taping;Vasopneumatic Device;Joint Manipulations    PT Next Visit Plan Review HEP and progress strength, ROM, gait and balance. modalities prn    PT Home Exercise Plan [id number]    Consulted and Agree with Plan of Care Patient             Patient will benefit from skilled therapeutic intervention in order to improve the following deficits and impairments:  Abnormal gait, Decreased range of motion, Pain, Decreased activity tolerance, Impaired flexibility, Decreased strength, Increased edema  Visit Diagnosis: Pain in right ankle and joints of right foot  Stiffness of right ankle, not elsewhere classified  Muscle weakness (generalized)  Localized edema  Other abnormalities of gait and mobility     Problem List  Patient Active Problem List   Diagnosis Date Noted   Tarsal coalition of right foot 02/19/2022   Contusion of foot, right 01/29/2022   Madelyn Flavors, PT 02/22/2022, 2:55 PM  [facility] [address]  [place], [place], [address] Phone: [phone]   Fax:  [phone]  Name: Heidi David MRN: [id number] Date of Birth: 1996-03-21

## 2022-02-27 ENCOUNTER — Other Ambulatory Visit: Payer: Self-pay

## 2022-02-27 ENCOUNTER — Ambulatory Visit: Payer: Worker's Compensation | Admitting: Physical Therapy

## 2022-02-27 ENCOUNTER — Encounter: Payer: Self-pay | Admitting: Physical Therapy

## 2022-02-27 DIAGNOSIS — M25671 Stiffness of right ankle, not elsewhere classified: Secondary | ICD-10-CM

## 2022-02-27 DIAGNOSIS — M25571 Pain in right ankle and joints of right foot: Secondary | ICD-10-CM

## 2022-02-27 DIAGNOSIS — M6281 Muscle weakness (generalized): Secondary | ICD-10-CM

## 2022-02-27 DIAGNOSIS — R600 Localized edema: Secondary | ICD-10-CM

## 2022-02-27 DIAGNOSIS — R2689 Other abnormalities of gait and mobility: Secondary | ICD-10-CM

## 2022-02-27 NOTE — Patient Instructions (Signed)
° ° °  Access Code: [email or web address] URL: [email or web address] Date: 02/27/2022 Prepared by: Glenetta Hew  Exercises Seated Ankle Circles - 2 x daily - 7 x weekly - 2 sets - 10 reps Seated Ankle Alphabet - 2 x daily - 7 x weekly - 1 sets - 1 reps Seated Ankle Inversion Eversion AROM - 2 x daily - 7 x weekly - 2 sets - 10 reps Standing Heel Raises - 1 x daily - 4 x weekly - 2 sets - 10 reps Long Sitting Plantar Fascia Stretch with Towel - 2-3 x daily - 7 x weekly - 3 reps - 30 sec hold Long Sitting Calf Stretch with Strap - 2-3 x daily - 7 x weekly - 3 reps - 30 sec hold Long Sitting Soleus Stretch on Bolster with Strap - 2-3 x daily - 7 x weekly - 3 reps - 30 sec hold Isometric Ankle Inversion - 2 x daily - 7 x weekly - 2 sets - 10 reps - 3-5 sec hold Isometric Ankle Eversion at Wall - 2 x daily - 7 x weekly - 2 sets - 10 reps - 3-5 sec hold

## 2022-02-27 NOTE — Therapy (Signed)
[facility] [address]  [address] [place], [place], [address] Phone: [phone]   Fax:  [phone]  Physical Therapy Treatment  Patient Details  Name: Heidi David MRN: [id number] Date of Birth: 06/20/1996 Referring Provider (PT): Clare Gandy   Encounter Date: 02/27/2022   PT End of Session - 02/27/22 1400     Visit Number 2    Date for PT Re-Evaluation 04/05/22    Authorization Type self pay    PT Start Time 1400    PT Stop Time 1443    PT Time Calculation (min) 43 min    Activity Tolerance Patient tolerated treatment well    Behavior During Therapy [facility] for tasks assessed/performed             Past Medical History:  Diagnosis Date   Medical history non-contributory     Past Surgical History:  Procedure Laterality Date   TONSILLECTOMY     tonsils and adenoids   WISDOM TOOTH EXTRACTION      There were no vitals filed for this visit.   Subjective Assessment - 02/27/22 1404     Subjective Pt reports pain worst with walking - 8/10 upon first getting up in the morning, typically down to 5/10 at best.    Pertinent History tarsal coalition    Diagnostic tests [id number] abnormal articulation between the anterior process of the  calcaneus and lateral aspect of the navicular with subcortical  reactive marrow changes concerning for a fibro-cartilaginous  coalition. Mild subcortical reactive marrow edema in the adjacent  plantar lateral aspect of the anterior talus.    Patient Stated Goals make it stop hurting and to walk normally; get back to work    Currently in Pain? Yes    Pain Score 7    8/10 upon rising in the morning, 5/10 at the lowest when walking   Pain Location Ankle    Pain Orientation Right;Medial;Lateral    Pain Descriptors / Indicators Tingling;Squeezing    Pain Type Acute pain    Pain Onset 1 to 4 weeks ago                               [facility] Adult PT Treatment/Exercise -  02/27/22 1400       Exercises   Exercises Ankle      Manual Therapy   Manual Therapy Joint mobilization;Soft tissue mobilization    Joint Mobilization Intertarsal glides - most painful btw 2nd & 3rd and 3rd & 4th metatarsals    Soft tissue mobilization STM to dorsal and plantar interossei muscles of R foot - TTP proximally at 3rd and 4th metatarsal      Ankle Exercises: Stretches   Plantar Fascia Stretch 2 reps;30 seconds   each position   Plantar Fascia Stretch Limitations figure-4 with hand pulling back on toes & long-sitting with towel wrapped over toes   pt preferring the latter   Soleus Stretch 2 reps;30 seconds    Soleus Stretch Limitations long-sitting with towel    Gastroc Stretch 2 reps;30 seconds    Gastroc Stretch Limitations long-sitting with towel      Ankle Exercises: Aerobic   Recumbent Bike L1 x 4 min   limited by increasing pain and cramping sensation in arch of foot     Ankle Exercises: Seated   ABC's 1 rep    Ankle Circles/Pumps Right;10 reps;AROM    Ankle Circles/Pumps Limitations CW  better tolerated than CCW with most pain in DF & inversion    Towel Crunch --   2 x 10   Other Seated Ankle Exercises R ankle inversion/eversion x 10    Other Seated Ankle Exercises R toe yoga - discontinued d/t increased pain      Ankle Exercises: Supine   Isometrics seated B ankle INV and L EVER isometrics 10 x 5"                       PT Short Term Goals - 02/27/22 1444       PT SHORT TERM GOAL #1   Title Ind with initial HEP    Status On-going    Target Date 03/08/22      PT SHORT TERM GOAL #2   Title Patient to ambulate with a normal gait pattern without increased pain in the R ankle    Status On-going    Target Date 03/15/22               PT Long Term Goals - 02/27/22 1445       PT LONG TERM GOAL #1   Title Ind with advanced HEP    Status On-going    Target Date 04/05/22      PT LONG TERM GOAL #2   Title Patient to demonstrate R ankle  DF to >= 8 degrees to normalize her gait and stairs    Status On-going    Target Date 04/05/22      PT LONG TERM GOAL #3   Title Pt able to climb stairs with a reciprocal gait pattern    Status On-going    Target Date 04/05/22      PT LONG TERM GOAL #4   Title Pt to demonstrate R ankle ROM to [facility] and be able to peform standing ADLs without pain >= 90% of the time.    Status On-going    Target Date 04/05/22      PT LONG TERM GOAL #5   Title Improved FOTO to 73 from 47 at intake showing functional improvement    Status On-going    Target Date 04/05/22                   Plan - 02/27/22 1443     Clinical Impression Statement - Carrah reports she still has considerable pain with walking, worst first thing in the morning at ~8/10 but only down to 5/10 at best. HEP going okay but CW is better than CCW with her ankle circles in the HEP, with the most pain in combined DF and inversion. She notes pain and TTP proximally between the right 2nd, 3rd and 4th metatarsals limiting tolerance for some exercises but able to tolerate long-sitting ankle and plantar fascia/foot intrinsic stretches and inversion/eversion isometrics w/o significant increased pain, therefore added to HEP.    Rehab Potential Excellent    PT Frequency 2x / week    PT Duration 6 weeks    PT Treatment/Interventions ADLs/Self Care Home Management;Cryotherapy;Electrical Stimulation;Iontophoresis 4mg /ml Dexamethasone;Moist Heat;Neuromuscular re-education;Balance training;Therapeutic exercise;Therapeutic activities;Gait training;Stair training;Patient/family education;Manual techniques;Dry needling;Passive range of motion;Taping;Vasopneumatic Device;Joint Manipulations    PT Next Visit Plan progress strength, ROM, gait and balance. modalities prn    PT Home Exercise Plan    Consulted and Agree with Plan of Care Patient             Patient will benefit from skilled therapeutic intervention in order to improve  the  following deficits and impairments:  Abnormal gait, Decreased range of motion, Pain, Decreased activity tolerance, Impaired flexibility, Decreased strength, Increased edema  Visit Diagnosis: Pain in right ankle and joints of right foot  Stiffness of right ankle, not elsewhere classified  Muscle weakness (generalized)  Localized edema  Other abnormalities of gait and mobility     Problem List Patient Active Problem List   Diagnosis Date Noted   Tarsal coalition of right foot 02/19/2022   Contusion of foot, right 01/29/2022    Marry Guan, PT 02/27/2022, 3:28 PM  [facility] [address]  [address] [place], [place], [address] Phone: [phone]   Fax:  [phone]  Name: Laverda Jolivette MRN: [id number] Date of Birth: [birth year]

## 2022-03-01 ENCOUNTER — Other Ambulatory Visit: Payer: Self-pay

## 2022-03-01 ENCOUNTER — Ambulatory Visit: Payer: Worker's Compensation | Attending: Family Medicine

## 2022-03-01 DIAGNOSIS — M25671 Stiffness of right ankle, not elsewhere classified: Secondary | ICD-10-CM | POA: Diagnosis present

## 2022-03-01 DIAGNOSIS — M6281 Muscle weakness (generalized): Secondary | ICD-10-CM | POA: Insufficient documentation

## 2022-03-01 DIAGNOSIS — R2689 Other abnormalities of gait and mobility: Secondary | ICD-10-CM | POA: Diagnosis present

## 2022-03-01 DIAGNOSIS — R600 Localized edema: Secondary | ICD-10-CM | POA: Insufficient documentation

## 2022-03-01 DIAGNOSIS — M25571 Pain in right ankle and joints of right foot: Secondary | ICD-10-CM | POA: Insufficient documentation

## 2022-03-01 NOTE — Therapy (Signed)
[place] [facility] ?[address]  [address] ?[place], [place], [address] ?Phone: [phone]   Fax:  [phone] ? ?Physical Therapy Treatment ? ?Patient Details  ?Name: Heidi David ?MRN: [id number] ?Date of Birth: 1996-06-08 ?Referring Provider (PT): Clare Gandy ? ? ?Encounter Date: 03/01/2022 ? ? PT End of Session - 03/01/22 1446   ? ? Visit Number 3   ? Date for PT Re-Evaluation 04/05/22   ? Authorization Type self pay   ? PT Start Time 1402   ? PT Stop Time 1443   ? PT Time Calculation (min) 41 min   ? Activity Tolerance Patient tolerated treatment well   ? Behavior During Therapy [facility] for tasks assessed/performed   ? ?  ?  ? ?  ? ? ?Past Medical History:  ?Diagnosis Date  ? Medical history non-contributory   ? ? ?Past Surgical History:  ?Procedure Laterality Date  ? TONSILLECTOMY    ? tonsils and adenoids  ? WISDOM TOOTH EXTRACTION    ? ? ?There were no vitals filed for this visit. ? ? Subjective Assessment - 03/01/22 1403   ? ? Subjective Pt reports tingling in the medial portion of the foot, it worsens with flexing the foot.   ? Pertinent History tarsal coalition   ? Diagnostic tests QHU:[id number] abnormal articulation between the anterior process of the  calcaneus and lateral aspect of the navicular with subcortical  reactive marrow changes concerning for a fibro-cartilaginous  coalition. Mild subcortical reactive marrow edema in the adjacent  plantar lateral aspect of the anterior talus.   ? Patient Stated Goals make it stop hurting and to walk normally; get back to work   ? Currently in Pain? Yes   ? Pain Score 3    ? Pain Location Foot   ? Pain Orientation Right;Medial   ? Pain Descriptors / Indicators Tingling   ? Pain Type Acute pain   ? ?  ?  ? ?  ? ? ? ? ? ? ? ? ? ? ? ? ? ? ? ? ? ? ? ? [facility] Adult PT Treatment/Exercise - 03/01/22 0001   ? ?  ? Ankle Exercises: Seated  ? Ankle Circles/Pumps Right;AROM;20 reps   ? Ankle Circles/Pumps Limitations cues to  decrease ROM for better tolerance: CW/CCW 20x  ? Towel Crunch 5 reps;Weights   10 reps no weight, 5 reps with lb  ? Other Seated Ankle Exercises R ankle inversion/eversion x 15 each  ? Other Seated Ankle Exercises R toe yoga x 10 , toe raise only  ?  ? Ankle Exercises: Aerobic  ? Recumbent Bike L2x6 min   ?  ? Ankle Exercises: Supine  ? T-Band ankle PF/DF with red TB sitting x15 each exercise  ? ?  ?  ? ?  ? ? ? ? ? ? ? ? ? ? ? ? PT Short Term Goals - 02/27/22 1444   ? ?  ? PT SHORT TERM GOAL #1  ? Title Ind with initial HEP   ? Status On-going   ? Target Date 03/08/22   ?  ? PT SHORT TERM GOAL #2  ? Title Patient to ambulate with a normal gait pattern without increased pain in the R ankle   ? Status On-going   ? Target Date 03/15/22   ? ?  ?  ? ?  ? ? ? ? PT Long Term Goals - 02/27/22 1445   ? ?  ? PT LONG  TERM GOAL #1  ? Title Ind with advanced HEP   ? Status On-going   ? Target Date 04/05/22   ?  ? PT LONG TERM GOAL #2  ? Title Patient to demonstrate R ankle DF to >= 8 degrees to normalize her gait and stairs   ? Status On-going   ? Target Date 04/05/22   ?  ? PT LONG TERM GOAL #3  ? Title Pt able to climb stairs with a reciprocal gait pattern   ? Status On-going   ? Target Date 04/05/22   ?  ? PT LONG TERM GOAL #4  ? Title Pt to demonstrate R ankle ROM to [facility] and be able to peform standing ADLs without pain >= 90% of the time.   ? Status On-going   ? Target Date 04/05/22   ?  ? PT LONG TERM GOAL #5  ? Title Improved FOTO to 73 from 47 at intake showing functional improvement   ? Status On-going   ? Target Date 04/05/22   ? ?  ?  ? ?  ? ? ? ? ? ? ? ? Plan - 03/01/22 1446   ? ? Clinical Impression Statement Pt was painful with ankle PF and IV today. Cues needed to stay within pain-free ROM with exercises. We were able to progress to concentric strengthening exercises with TB and added these exercises to HEP. She notes benefit fo far from HEP but at this point continues to be limited with WB tolerance.   ? PT  Frequency 2x / week   ? PT Duration 6 weeks   ? PT Treatment/Interventions ADLs/Self Care Home Management;Cryotherapy;Electrical Stimulation;Iontophoresis 4mg /ml Dexamethasone;Moist Heat;Neuromuscular re-education;Balance training;Therapeutic exercise;Therapeutic activities;Gait training;Stair training;Patient/family education;Manual techniques;Dry needling;Passive range of motion;Taping;Vasopneumatic Device;Joint Manipulations   ? PT Next Visit Plan emphasize pain-free ROM,progress strength, ROM, gait and balance. modalities prn   ? PT Home Exercise Plan   ? Consulted and Agree with Plan of Care Patient   ? ?  ?  ? ?  ? ? ?Patient will benefit from skilled therapeutic intervention in order to improve the following deficits and impairments:  Abnormal gait, Decreased range of motion, Pain, Decreased activity tolerance, Impaired flexibility, Decreased strength, Increased edema ? ?Visit Diagnosis: ?Pain in right ankle and joints of right foot ? ?Stiffness of right ankle, not elsewhere classified ? ?Muscle weakness (generalized) ? ?Localized edema ? ?Other abnormalities of gait and mobility ? ? ? ? ?Problem List ?Patient Active Problem List  ? Diagnosis Date Noted  ? 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 Contusion of foot, right 01/29/2022  ? ? ?01/31/2022, PTA ?03/01/2022, 2:54 PM ? ?[name] ?[facility] ?[address] ?[place], [place], [place] ?Phone: [phone]   Fax:  [phone] ? ?Name: Heidi David ?MRN: Cordie Grice ?Date of Birth: Jul 18, 1996 ? ? ? ?

## 2022-03-06 ENCOUNTER — Ambulatory Visit: Payer: Worker's Compensation

## 2022-03-08 ENCOUNTER — Ambulatory Visit: Payer: Worker's Compensation

## 2022-03-13 ENCOUNTER — Ambulatory Visit: Payer: Worker's Compensation

## 2022-03-13 ENCOUNTER — Other Ambulatory Visit: Payer: Self-pay

## 2022-03-13 DIAGNOSIS — M25571 Pain in right ankle and joints of right foot: Secondary | ICD-10-CM | POA: Diagnosis not present

## 2022-03-13 DIAGNOSIS — M6281 Muscle weakness (generalized): Secondary | ICD-10-CM

## 2022-03-13 DIAGNOSIS — R600 Localized edema: Secondary | ICD-10-CM

## 2022-03-13 DIAGNOSIS — M25671 Stiffness of right ankle, not elsewhere classified: Secondary | ICD-10-CM

## 2022-03-13 DIAGNOSIS — R2689 Other abnormalities of gait and mobility: Secondary | ICD-10-CM

## 2022-03-13 NOTE — Patient Instructions (Signed)
Access Code: [email or web address] ?URL: [email or web address] ?Date: 03/13/2022 ?Prepared by: Verta Ellen ? ?Exercises ?Seated Ankle Circles - 2 x daily - 7 x weekly - 2 sets - 10 reps ?Seated Ankle Alphabet - 2 x daily - 7 x weekly - 1 sets - 1 reps ?Seated Ankle Inversion Eversion AROM - 2 x daily - 7 x weekly - 2 sets - 10 reps ?Standing Heel Raises - 1 x daily - 4 x weekly - 2 sets - 10 reps ?Long Sitting Plantar Fascia Stretch with Towel - 2-3 x daily - 7 x weekly - 3 reps - 30 sec hold ?Long Sitting Calf Stretch with Strap - 2-3 x daily - 7 x weekly - 3 reps - 30 sec hold ?Long Sitting Soleus Stretch on Bolster with Strap - 2-3 x daily - 7 x weekly - 3 reps - 30 sec hold ?Isometric Ankle Inversion - 2 x daily - 7 x weekly - 2 sets - 10 reps - 3-5 sec hold ?Isometric Ankle Eversion at Wall - 2 x daily - 7 x weekly - 2 sets - 10 reps - 3-5 sec hold ?Long Sitting Ankle Plantar Flexion with Resistance - 1 x daily - 3-4 x weekly - 3 sets - 10 reps ?Long Sitting Ankle Dorsiflexion with Anchored Resistance - 1 x daily - 3-4 x weekly - 3 sets - 10 reps ?Long Sitting Ankle Eversion with Resistance - 1 x daily - 3-4 x weekly - 3 sets - 10 reps ?Long Sitting Ankle Inversion with Resistance - 1 x daily - 3-4 x weekly - 3 sets - 10 reps ? ?

## 2022-03-13 NOTE — Therapy (Signed)
[place] ?[facility] ?[address] ?[place], [place], [address] ?Phone: [phone]   Fax:  [phone] ? ?Physical Therapy Treatment ? ?Patient Details  ?Name: Heidi David ?MRN: [id number] ?Date of Birth: [birth year] ?Referring Provider (PT): Clearance Coots ? ? ?Encounter Date: 03/13/2022 ? ? PT End of Session - 03/13/22 1617   ? ? Visit Number 4   ? Date for PT Re-Evaluation 04/05/22   ? Authorization Type self pay   ? PT Start Time 1533   ? PT Stop Time 0626   ? PT Time Calculation (min) 41 min   ? Activity Tolerance Patient tolerated treatment well;Patient limited by pain   ? Behavior During Therapy [facility] for tasks assessed/performed   ? ?  ?  ? ?  ? ? ?Past Medical History:  ?Diagnosis Date  ? Medical history non-contributory   ? ? ?Past Surgical History:  ?Procedure Laterality Date  ? TONSILLECTOMY    ? tonsils and adenoids  ? WISDOM TOOTH EXTRACTION    ? ? ?There were no vitals filed for this visit. ? ? Subjective Assessment - 03/13/22 1533   ? ? Subjective Pt reports having a lot of pain along the arch, which is why she didn't come last session.   ? Pertinent History tarsal coalition   ? 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 Mild subcortical reactive marrow edema in the adjacent  plantar lateral aspect of the anterior talus.   ? Patient Stated Goals make it stop hurting and to walk normally; get back to work   ? Currently in Pain? Yes   ? Pain Score 5    ? Pain Location Foot   ? Pain Orientation Right;Medial   ? Pain Descriptors / Indicators Tingling   ? Pain Type Acute pain   ? ?  ?  ? ?  ? ? ? ? ? ? ? ? ? ? ? ? ? ? ? ? ? ? ? ? [facility] [place] Adult PT Treatment/Exercise - 03/13/22 0001   ? ?  ? Manual Therapy  ? Manual Therapy Passive ROM   ? Soft tissue mobilization R ankle all  motions with distraction   ?  ? Ankle Exercises: Aerobic  ? Recumbent Bike L2x6 min   ?  ? Ankle Exercises: Seated  ? ABC's 1 rep   ? Heel Raises Both;20 reps   ? Toe Raise 20 reps   Both  ? Other Seated Ankle Exercises 4 way ankle red TB 20 reps   ?  ? Ankle Exercises: Stretches  ? Gastroc Stretch 3 reps;10 seconds   ? Gastroc Stretch Limitations runner stretch   ? ?  ?  ? ?  ? ? ? ? ? ? ? ? ? ? ? ? PT Short Term Goals - 03/13/22 1809   ? ?  ? PT SHORT TERM GOAL #1  ? Title Ind with initial HEP   ? Status Achieved   03/13/22  ? Target Date 03/08/22   ?  ? PT SHORT TERM GOAL #2  ? Title Patient to ambulate with a normal gait pattern without increased pain in the R ankle   ? Status Partially Met   7/10 pain when walking- 03/13/22  ? Target Date 03/15/22   ? ?  ?  ? ?  ? ? ? ?  PT Long Term Goals - 02/27/22 1445   ? ?  ? PT LONG TERM GOAL #1  ? Title Ind with advanced HEP   ? Status On-going   ? Target Date 04/05/22   ?  ? PT LONG TERM GOAL #2  ? Title Patient to demonstrate R ankle DF to >= 8 degrees to normalize her gait and stairs   ? Status On-going   ? Target Date 04/05/22   ?  ? PT LONG TERM GOAL #3  ? Title Pt able to climb stairs with a reciprocal gait pattern   ? Status On-going   ? Target Date 04/05/22   ?  ? PT LONG TERM GOAL #4  ? Title Pt to demonstrate R ankle ROM to [facility] and be able to peform standing ADLs without pain >= 90% of the time.   ? Status On-going   ? Target Date 04/05/22   ?  ? PT LONG TERM GOAL #5  ? Title Improved FOTO to 73 from 47 at intake showing functional improvement   ? Status On-going   ? Target Date 04/05/22   ? ?  ?  ? ?  ? ? ? ? ? ? ? ? Plan - 03/13/22 1617   ? ? Clinical Impression Statement Pt still mostly in pain throughout the day, however had less pain today with the exercises. Able to progress 4 way ankle exercises. Today pt was able to progress in WB, however a little pain noted with runner stretch. Updated HEP with 4 way ankle, still with RTB. Pt demonstrates a normal gait  pattern but noting 7/10 pain when ambulating but did tolerate the exercises better than last session.   ? PT Frequency 2x / week   ? PT Duration 6 weeks   ? PT Treatment/Interventions ADLs/Self Care Home Management;Cryotherapy;Electrical Stimulation;Iontophoresis 46m/ml Dexamethasone;Moist Heat;Neuromuscular re-education;Balance training;Therapeutic exercise;Therapeutic activities;Gait training;Stair training;Patient/family education;Manual techniques;Dry needling;Passive range of motion;Taping;Vasopneumatic Device;Joint Manipulations   ? PT Next Visit Plan emphasize pain-free ROM,progress strength, ROM, gait and balance. modalities prn   ? PT Home Exercise Plan [email or web address]  ? Consulted and Agree with Plan of Care Patient   ? ?  ?  ? ?  ? ? ?Patient will benefit from skilled therapeutic intervention in order to improve the following deficits and impairments:  Abnormal gait, Decreased range of motion, Pain, Decreased activity tolerance, Impaired flexibility, Decreased strength, Increased edema ? ?Visit Diagnosis: ?Pain in right ankle and joints of right foot ? ?Stiffness of right ankle, not elsewhere classified ? ?Muscle weakness (generalized) ? ?Localized edema ? ?Other abnormalities of gait and mobility ? ? ? ? ?Problem List ?Patient Active Problem List  ? Diagnosis Date Noted  ? 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 Contusion of foot, right 01/29/2022  ? ? ?BArtist Pais PTA ?03/13/2022, 6:12 PM ? ?[place] ?[facility] ?[address] [address]?Phone: [phone]  Fax:  [phone]? ?Name: KKaliyah Gladman?MRN: [id number]?Date of Birth: 1December 02, 1997? ? ? ?

## 2022-03-15 ENCOUNTER — Telehealth: Payer: Self-pay | Admitting: Family Medicine

## 2022-03-15 NOTE — Telephone Encounter (Signed)
Previously pt's Employer had DENIED WC claim-- ? ?--03/15/22 [place] -HP rcvd call frm [place] [employer] Mgt nuse case mgr/ Anderson Malta states pt's claim has now been Approved &she will be attending pt next OV. ? Per NCM they will be faxing all pertinent WC carrier info to our office for continued treatment. ?-FYI ?glh ?

## 2022-03-20 ENCOUNTER — Ambulatory Visit: Payer: Worker's Compensation | Admitting: Physical Therapy

## 2022-03-22 ENCOUNTER — Ambulatory Visit: Payer: Worker's Compensation | Admitting: Physical Therapy

## 2022-03-27 ENCOUNTER — Ambulatory Visit: Payer: Worker's Compensation | Admitting: Physical Therapy

## 2022-03-27 ENCOUNTER — Encounter: Payer: Self-pay | Admitting: Physical Therapy

## 2022-03-27 ENCOUNTER — Other Ambulatory Visit: Payer: Self-pay

## 2022-03-27 DIAGNOSIS — M25571 Pain in right ankle and joints of right foot: Secondary | ICD-10-CM

## 2022-03-27 DIAGNOSIS — R2689 Other abnormalities of gait and mobility: Secondary | ICD-10-CM

## 2022-03-27 DIAGNOSIS — M6281 Muscle weakness (generalized): Secondary | ICD-10-CM

## 2022-03-27 DIAGNOSIS — R600 Localized edema: Secondary | ICD-10-CM

## 2022-03-27 DIAGNOSIS — M25671 Stiffness of right ankle, not elsewhere classified: Secondary | ICD-10-CM

## 2022-03-27 NOTE — Therapy (Addendum)
[place] ?[facility] ?[address] ?[place], [place], [address] ?Phone: [phone]   Fax:  [phone] ? ?Physical Therapy Treatment / Recert ? ?Patient Details  ?Name: Heidi David ?MRN: [id number] ?Date of Birth: 05-Sep-1996 ?Referring Provider (PT): Heidi David ? ? ? ?Progress Note ? ?Reporting Period 02/22/2022 to 03/27/2022 ? ?See note below for Objective Data and Assessment of Progress/Goals.  ? ? ? ?Encounter Date: 03/27/2022 ? ? PT End of Session - 03/27/22 1100   ? ? Visit Number 5   ? Date for PT Re-Evaluation 05/08/22   ? Authorization Type self pay   ? PT Start Time 1100   ? PT Stop Time 1144   ? PT Time Calculation (min) 44 min   ? Activity Tolerance Patient tolerated treatment well   ? Behavior During Therapy [facility] for tasks assessed/performed   ? ?  ?  ? ?  ? ? ?Past Medical History:  ?Diagnosis Date  ? Medical history non-contributory   ? ? ?Past Surgical History:  ?Procedure Laterality Date  ? TONSILLECTOMY    ? tonsils and adenoids  ? WISDOM TOOTH EXTRACTION    ? ? ?There were no vitals filed for this visit. ? ? Subjective Assessment - 03/27/22 1104   ? ? Subjective Pt reports she feels like she can stand longer on her foot but after ~1 hr she has to sit down. Still has pain with certain movements of her ankle.   ? Pertinent History tarsal coalition   ? 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 Mild subcortical reactive marrow edema in the adjacent  plantar lateral aspect of the anterior talus.   ? Patient Stated Goals make it stop hurting and to walk normally; get back to work   ? Currently in Pain? No/denies   ? ?  ?  ? ?  ? ? ? ? ? [facility] PT Assessment - 03/27/22 1100   ? ?  ? Assessment  ? Medical Diagnosis tarsal coalition of R foot; contusion of R foot   ?  Referring Provider (PT) Heidi David   ? Onset Date/Surgical Date 01/28/22   ? Next MD Visit 04/05/22   ?  ? Prior Function  ? Level of Independence Independent   ? Architect;Full time employment   ? Vocation Requirements EMT and CNA   ? Leisure would like to get back to working out   ?  ? Observation/Other Assessments  ? Focus on Therapeutic Outcomes (FOTO)  52 (5 point improvement from eval); predicted 73   ?  ? AROM  ? Right Ankle Dorsiflexion 1   ? Right Ankle Plantar Flexion 52   ? Right Ankle Inversion 26   ? Right Ankle Eversion 12   ?  ? PROM  ? Right Ankle Dorsiflexion 10   ? Right Ankle Eversion 18   ?  ? Strength  ? Right Ankle Dorsiflexion 5/5   ? Right Ankle Plantar Flexion 4/5   9 SLS heel raises - pt noted to invert ankle with fatigue on last few reps  ? Right Ankle Inversion 5/5   ? Right Ankle Eversion 4+/5   ?  ? Special Tests  ? Other special tests R SLS = 38 sec   ? ?  ?  ? ?  ? ? ? ? ? ? ? ? ? ? ? ? ? ? ? ?  [facility] [place] Adult PT Treatment/Exercise - 03/27/22 1100   ? ?  ? Ambulation/Gait  ? Ambulation/Gait Assistance 7: Independent   ? Ambulation Distance (Feet) 150 Feet   ? Assistive device None   ? Gait Pattern Within Functional Limits   ? Stairs Yes   ? Stairs Assistance 6: Modified independent (Device/Increase time)   ? Stair Management Technique One rail Right;Alternating pattern;Step to pattern;Forwards   ? Number of Stairs 14   ? Height of Stairs 7   ? Gait Comments reciprocal pattern on ascent but slight favoring of R foot; mixed reciprocal and step-to pattern leading with both R and L foot on descent - limited by pain and limited DF ROM   ?  ? Exercises  ? Exercises Ankle   ?  ? Manual Therapy  ? Manual Therapy Joint mobilization;Passive ROM   ? Joint Mobilization R ankle talocrural posterior glide (grade II-III)   ? Passive ROM R ankle all motions with distraction   ?  ? Ankle Exercises: Aerobic  ? Recumbent Bike L4 x 6 min   ?  ? Ankle Exercises: Standing & Seated  ? Heel Raises  Right;10 reps   ? Heel Raises Limitations pt rolling onto to outer foot (ankle inversion) with latter reps   ? Other Seated Ankle Exercises 4 way ankle green TB x 20 reps   ? ?  ?  ? ?  ? ? ? ? ? ? ? ? ? ? PT Education - 03/27/22 1144   ? ? Education Details HEP 4-way ankle progressed to green TB   ? Person(s) Educated Patient   ? Methods Explanation;Demonstration   ? Comprehension Verbalized understanding;Returned demonstration   ? ?  ?  ? ?  ? ? ? PT Short Term Goals - 03/27/22 1106   ? ?  ? PT SHORT TERM GOAL #1  ? Title Ind with initial HEP   ? Status Achieved   03/13/22  ? Target Date 03/08/22   ?  ? PT SHORT TERM GOAL #2  ? Title Patient to ambulate with a normal gait pattern without increased pain in the R ankle   ? Status Achieved   03/27/22  ? Target Date 03/15/22   ? ?  ?  ? ?  ? ? ? ? PT Long Term Goals - 03/27/22 1107   ? ?  ? PT LONG TERM GOAL #1  ? Title Ind with advanced HEP   ? Status Partially Met   ? Target Date 05/08/22   ?  ? PT LONG TERM GOAL #2  ? Title Patient to demonstrate R ankle DF to >= 8 degrees to normalize her gait and stairs   ? Status On-going   03/27/22 - R ankle DF improved from -10? to +1?  ? Target Date 05/08/22   ?  ? PT LONG TERM GOAL #3  ? Title Pt able to climb stairs with a reciprocal gait pattern   ? Status Partially Met   03/27/22 - reciprocal ascent with intermittent reciprocal descent - limited by pain and limited DF ROM  ? Target Date 05/08/22   ?  ? PT LONG TERM GOAL #4  ? Title Pt to demonstrate R ankle ROM to [facility] and be able to peform standing ADLs without pain >= 90% of the time.   ? Status Partially Met   03/27/22 - okay with everyday tasks but limited by pain when she gets into "cleaning mode" if she is on her feet for >  1 hr  ? Target Date 05/08/22   ?  ? PT LONG TERM GOAL #5  ? Title Improved FOTO to 73 from 47 at intake showing functional improvement   ? Status On-going   03/27/22 - FOTO = 52  ? Target Date 05/08/22   ? ?  ?  ? ?  ? ? ? ? ? ? ? ? Plan - 03/27/22 1144    ? ? Clinical Impression Statement Heidi David reports ankle pain improving allowing for increased standing/walking tolerance up to ~1 hour but then needs to sit down. Stairs are still more painful with inconsistent ability to use reciprocal pattern on descent. She continues to complete HEP daily and denies any issues but review/progression of 4-way TB resisted ankle strengthening requiring cues for setup and isolation of desire movement patterns. R ankle ROM improving but still limited AROM into DF and EV, although more ROM available with PROM. Continued R ankle weakness still evident with weight bearing PF - pt only able to complete 9 SLS heel raises with pt tending to invert ankle on latter reps. Pt progressing toward goals, but will continue to benefit from skilled PT to promote restoration of full R ankle ROM and strength w/o pain interference to allow for return to normal daily activities including working, hence will recommend recert for additional 2x/wk for up to 6 weeks.   ? Rehab Potential Excellent   ? PT Frequency 2x / week   ? PT Duration 6 weeks   ? PT Treatment/Interventions ADLs/Self Care Home Management;Cryotherapy;Electrical Stimulation;Iontophoresis 65m/ml Dexamethasone;Moist Heat;Neuromuscular re-education;Balance training;Therapeutic exercise;Therapeutic activities;Gait training;Stair training;Patient/family education;Manual techniques;Dry needling;Passive range of motion;Taping;Vasopneumatic Device;Joint Manipulations   ? PT Next Visit Plan emphasize pain-free ROM, progress strength - esp PF and eversion, ROM, gait and balance. modalities prn   ? PT Home Exercise Plan [email or web address]  ? Consulted and Agree with Plan of Care Patient   ? ?  ?  ? ?  ? ? ?Patient will benefit from skilled therapeutic intervention in order to improve the following deficits and impairments:  Abnormal gait, Decreased range of motion, Pain, Decreased activity tolerance, Impaired flexibility, Decreased strength, Increased  edema ? ?Visit Diagnosis: ?Pain in right ankle and joints of right foot ? ?Stiffness of right ankle, not elsewhere classified ? ?Muscle weakness (generalized) ? ?Localized edema ? ?Other abnormalities of gait and mobilit

## 2022-03-29 ENCOUNTER — Encounter: Payer: Self-pay | Admitting: Physical Therapy

## 2022-03-29 ENCOUNTER — Ambulatory Visit: Payer: Worker's Compensation | Admitting: Physical Therapy

## 2022-03-29 DIAGNOSIS — M6281 Muscle weakness (generalized): Secondary | ICD-10-CM

## 2022-03-29 DIAGNOSIS — R600 Localized edema: Secondary | ICD-10-CM

## 2022-03-29 DIAGNOSIS — M25571 Pain in right ankle and joints of right foot: Secondary | ICD-10-CM

## 2022-03-29 DIAGNOSIS — R2689 Other abnormalities of gait and mobility: Secondary | ICD-10-CM

## 2022-03-29 DIAGNOSIS — M25671 Stiffness of right ankle, not elsewhere classified: Secondary | ICD-10-CM

## 2022-03-29 NOTE — Patient Instructions (Signed)

## 2022-03-29 NOTE — Therapy (Signed)
[place] [facility] ?[place] ?[place], [place], [address] ?Phone: [phone]   Fax:  [phone] ? ?Physical Therapy Treatment ? ?Patient Details  ?Name: Heidi David ?MRN: [id number] ?Date of Birth: 1996-10-14 ?Referring Provider (PT): Clearance Coots ? ? ?Encounter Date: 03/29/2022 ? ? PT End of Session - 03/29/22 1315   ? ? Visit Number 6   ? Date for PT Re-Evaluation 05/08/22   ? Authorization Type self pay   ? PT Start Time 1315   ? PT Stop Time 1443   ? PT Time Calculation (min) 44 min   ? Activity Tolerance Patient tolerated treatment well   ? Behavior During Therapy [facility] for tasks assessed/performed   ? ?  ?  ? ?  ? ? ?Past Medical History:  ?Diagnosis Date  ? Medical history non-contributory   ? ? ?Past Surgical History:  ?Procedure Laterality Date  ? TONSILLECTOMY    ? tonsils and adenoids  ? WISDOM TOOTH EXTRACTION    ? ? ?There were no vitals filed for this visit. ? ? Subjective Assessment - 03/29/22 1318   ? ? 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 She states her ankle/foot is a little sore today after being on her feet alot so far today.   ? Pertinent History tarsal coalition   ? 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 Mild subcortical reactive marrow edema in the adjacent  plantar lateral aspect of the anterior talus.   ? Patient Stated Goals make it stop hurting and to walk normally; get back to work   ? Currently in Pain? Yes   ? Pain Score 2    ? Pain Location Ankle   ? Pain Orientation Right;Medial   ? ?  ?  ? ?  ? ? ? ? ? ? ? ? ? ? ? ? ? ? ? ? ? ? ? ? [facility] PT Treatment/Exercise - 03/29/22 1315   ? ?  ? Exercises  ? Exercises Ankle   ?  ? Manual  Therapy  ? Manual Therapy Joint mobilization;Soft tissue mobilization;Myofascial release;Passive ROM   ? Joint Mobilization R ankle talocrural posterior glide (grade II-III)   ? Soft tissue mobilization STM to dorsal and plantar interossei muscles of R foot, FDL, abductor hallicus   ? Passive ROM R ankle all motions with distraction   ?  ? Ankle Exercises: Stretches  ? Plantar Fascia Stretch 2 reps;30 seconds   ? Plantar Fascia Stretch Limitations figure-4 with hand pulling back on toes & long-sitting with towel wrapped over toes   ?  ? Ankle Exercises: Aerobic  ? Recumbent Bike L4 x 6 min   ?  ? Ankle Exercises: Standing  ? Heel Raises Both;10 reps;3 seconds   2 sets  ? Heel Raises Limitations + ball squeeze btw ankles to promote neutral alignment   ?  ? Ankle Exercises: Seated  ? Heel Raises Both;20 reps;3 seconds   ? Heel Raises Limitations + ball squeeze btw ankles to promote neutral alignment   ? Other Seated Ankle Exercises R toe yoga x 10 with small red TB loop around toes   ?  ? Ankle Exercises: Supine  ?  T-Band Long-sitting red TB R toe curls x 20   ? ?  ?  ? ?  ? ? ? ? ? ? ? ? ? ? PT Education - 03/29/22 1359   ? ? Education Details Role of DN in addressing pain and abdnormal muscle tension   ? Person(s) Educated Patient   ? Methods Explanation;Handout   ? Comprehension Verbalized understanding   ? ?  ?  ? ?  ? ? ? PT Short Term Goals - 03/27/22 1106   ? ?  ? PT SHORT TERM GOAL #1  ? Title Ind with initial HEP   ? Status Achieved   03/13/22  ? Target Date 03/08/22   ?  ? PT SHORT TERM GOAL #2  ? Title Patient to ambulate with a normal gait pattern without increased pain in the R ankle   ? Status Achieved   03/27/22  ? Target Date 03/15/22   ? ?  ?  ? ?  ? ? ? ? PT Long Term Goals - 03/27/22 1107   ? ?  ? PT LONG TERM GOAL #1  ? Title Ind with advanced HEP   ? Status Partially Met   ? Target Date 05/08/22   ?  ? PT LONG TERM GOAL #2  ? Title Patient to demonstrate R ankle DF to >= 8 degrees to normalize her  gait and stairs   ? Status On-going   03/27/22 - R ankle DF improved from -10? to +1?  ? Target Date 05/08/22   ?  ? PT LONG TERM GOAL #3  ? Title Pt able to climb stairs with a reciprocal gait pattern   ? Status Partially Met   03/27/22 - reciprocal ascent with intermittent reciprocal descent - limited by pain and limited DF ROM  ? Target Date 05/08/22   ?  ? PT LONG TERM GOAL #4  ? Title Pt to demonstrate R ankle ROM to [facility] and be able to peform standing ADLs without pain >= 90% of the time.   ? Status Partially Met   03/27/22 - okay with everyday tasks but limited by pain when she gets into "cleaning mode" if she is on her feet for >1 hr  ? Target Date 05/08/22   ?  ? PT LONG TERM GOAL #5  ? Title Improved FOTO to 73 from 47 at intake showing functional improvement   ? Status On-going   03/27/22 - FOTO = 52  ? Target Date 05/08/22   ? ?  ?  ? ?  ? ? ? ? ? ? ? ? Plan - 03/29/22 1359   ? ? Clinical Impression Statement Ayriana reports improving tolerance for being on her feet but still feels the need to sit down and rest at times. She continues to note soreness on dorsum of R foot with TTP along dorsal interossei as well as pain along medial ankle extending along medial foot to toes. Limited ability to spread toes and isolate great toe motion esp into extension likely indicating some abnormal muscle tension, thus discussed possibility of DN and provided information for patient review - may consider this is pain/increased muscle tension persists.   ? Rehab Potential Excellent   ? PT Frequency 2x / week   ? PT Duration 6 weeks   ? PT Treatment/Interventions ADLs/Self Care Home Management;Cryotherapy;Electrical Stimulation;Iontophoresis 21m/ml Dexamethasone;Moist Heat;Neuromuscular re-education;Balance training;Therapeutic exercise;Therapeutic activities;Gait training;Stair training;Patient/family education;Manual techniques;Dry needling;Passive range of motion;Taping;Vasopneumatic Device;Joint Manipulations   ? PT Next  Visit  Plan emphasize pain-free ROM, progress strength - esp PF and eversion, ROM, gait and balance. modalities prn   ? PT Home Exercise Plan [id number]   ? Consulted and Agree with Plan of Care Patient   ? ?  ?  ? ?  ? ? ?Patient will benefit from skilled therapeutic intervention in order to improve the following deficits and impairments:  Abnormal gait, Decreased range of motion, Pain, Decreased activity tolerance, Impaired flexibility, Decreased strength, Increased edema ? ?Visit Diagnosis: ?Pain in right ankle and joints of right foot ? ?Stiffness of right ankle, not elsewhere classified ? ?Muscle weakness (generalized) ? ?Localized edema ? ?Other abnormalities of gait and mobility ? ? ? ? ?Problem List ?Patient Active Problem List  ? Diagnosis Date Noted  ? 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 Contusion of foot, right 01/29/2022  ? ? ?Percival Spanish, PT ?03/29/2022, 4:35 PM ? ?[place] [facility] ?[address] ?[place], [place], [address] ?Phone: [phone]   Fax:  [phone] ? ?Name: Ani Deoliveira ?MRN: [id number] ?Date of Birth: 06/16/1996 ? ? ? ?

## 2022-04-05 ENCOUNTER — Ambulatory Visit (INDEPENDENT_AMBULATORY_CARE_PROVIDER_SITE_OTHER): Payer: Worker's Compensation | Admitting: Family Medicine

## 2022-04-05 ENCOUNTER — Encounter: Payer: Self-pay | Admitting: Family Medicine

## 2022-04-05 VITALS — Ht 62.0 in | Wt 203.0 lb

## 2022-04-05 DIAGNOSIS — Q6689 Other  specified congenital deformities of feet: Secondary | ICD-10-CM | POA: Diagnosis not present

## 2022-04-05 DIAGNOSIS — S9031XD Contusion of right foot, subsequent encounter: Secondary | ICD-10-CM

## 2022-04-05 NOTE — Patient Instructions (Signed)
Good to see you  ?Please continue the physical therapy  ?Please send me a message in MyChart with any questions or updates.  ?Please see me back to continue shockwave therapy.  ? ?--Dr. Jordan Likes ? ?

## 2022-04-05 NOTE — Progress Notes (Signed)
?  Heidi David - 26 y.o. female MRN [id number]  Date of birth: April 16, 1996 ? ?SUBJECTIVE:  Including CC & ROS.  ?No chief complaint on file. ? ? ?Heidi David is a 26 y.o. female that is here for shockwave therapy. ? ? ? ?Review of Systems ?See HPI  ? ?HISTORY: Past Medical, Surgical, Social, and Family History Reviewed & Updated per EMR.   ?Pertinent Historical Findings include: ? ?Past Medical History:  ?Diagnosis Date  ? Medical history non-contributory   ? ? ?Past Surgical History:  ?Procedure Laterality Date  ? TONSILLECTOMY    ? tonsils and adenoids  ? WISDOM TOOTH EXTRACTION    ? ? ? ?PHYSICAL EXAM:  ?VS: Ht 5\' 2"  (1.575 m)   Wt 203 lb (92.1 kg)   BMI 37.13 kg/m?  ?Physical Exam ?Gen: NAD, alert, cooperative with exam, well-appearing ?MSK:  ?Neurovascularly intact   ? ?ECSWT Note ? ?09-[birth year]95 ? ?Procedure: ECSWT ?Indications: right foot pain ? ?Procedure Details ?Consent: Risks of procedure as well as the alternatives and risks of each were explained to the (patient/caregiver).  Consent for procedure obtained. ?Time Out: Verified patient identification, verified procedure, site/side was marked, verified correct patient position, special equipment/implants available, medications/allergies/relevent history reviewed, required imaging and test results available.  Performed.  The area was cleaned with iodine and alcohol swabs.   ? ?The right foot was targeted for Extracorporeal shockwave therapy.  ? ?Preset: plantar fasciitis  ?Power Level: 40 ?Frequency: 10 ?Impulse/cycles: 2600 ?Head size: medium  ?Session: 1st ? ?Patient did tolerate procedure well. ? ? ? ?ASSESSMENT & PLAN:  ? ?Tarsal coalition of right foot ?Completed shockwave therapy  ?- provided work note  ? ? ? ? ?

## 2022-04-05 NOTE — Assessment & Plan Note (Signed)
Completed shockwave therapy  ?- provided work note  ?

## 2022-04-10 ENCOUNTER — Ambulatory Visit: Payer: Medicaid Other | Admitting: Family Medicine

## 2022-04-10 ENCOUNTER — Ambulatory Visit: Payer: Worker's Compensation | Attending: Family Medicine | Admitting: Physical Therapy

## 2022-04-10 DIAGNOSIS — M25571 Pain in right ankle and joints of right foot: Secondary | ICD-10-CM | POA: Insufficient documentation

## 2022-04-10 DIAGNOSIS — R600 Localized edema: Secondary | ICD-10-CM | POA: Insufficient documentation

## 2022-04-10 DIAGNOSIS — M25671 Stiffness of right ankle, not elsewhere classified: Secondary | ICD-10-CM | POA: Insufficient documentation

## 2022-04-10 DIAGNOSIS — R2689 Other abnormalities of gait and mobility: Secondary | ICD-10-CM | POA: Insufficient documentation

## 2022-04-10 DIAGNOSIS — M6281 Muscle weakness (generalized): Secondary | ICD-10-CM | POA: Insufficient documentation

## 2022-04-16 ENCOUNTER — Ambulatory Visit (INDEPENDENT_AMBULATORY_CARE_PROVIDER_SITE_OTHER): Payer: Worker's Compensation | Admitting: Family Medicine

## 2022-04-16 DIAGNOSIS — Q6689 Other  specified congenital deformities of feet: Secondary | ICD-10-CM

## 2022-04-16 NOTE — Assessment & Plan Note (Signed)
Completed shockwave therapy  

## 2022-04-16 NOTE — Progress Notes (Signed)
?  Heidi David - 26 y.o. female MRN [id number]  Date of birth: [birth year] ? ?SUBJECTIVE:  Including CC & ROS.  ?No chief complaint on file. ? ? ?Heidi David is a 26 y.o. female that is here for shockwave therapy. ? ? ? ?Review of Systems ?See HPI  ? ?HISTORY: Past Medical, Surgical, Social, and Family History Reviewed & Updated per EMR.   ?Pertinent Historical Findings include: ? ?Past Medical History:  ?Diagnosis Date  ? Medical history non-contributory   ? ? ?Past Surgical History:  ?Procedure Laterality Date  ? TONSILLECTOMY    ? tonsils and adenoids  ? WISDOM TOOTH EXTRACTION    ? ? ? ?PHYSICAL EXAM:  ?VS: Ht 5\' 2"  (1.575 m)   BMI 37.13 kg/m?  ?Physical Exam ?Gen: NAD, alert, cooperative with exam, well-appearing ?MSK:  ?Neurovascularly intact   ? ?ECSWT Note ?Heidi David ?1996-10-28 ? ?Procedure: ECSWT ?Indications: right foot pain ? ?Procedure Details ?Consent: Risks of procedure as well as the alternatives and risks of each were explained to the (patient/caregiver).  Consent for procedure obtained. ?Time Out: Verified patient identification, verified procedure, site/side was marked, verified correct patient position, special equipment/implants available, medications/allergies/relevent history reviewed, required imaging and test results available.  Performed.  The area was cleaned with iodine and alcohol swabs.   ? ?The right foot was targeted for Extracorporeal shockwave therapy.  ? ?Preset: plantar fasciitis  ?Power Level: 50 ?Frequency: 10 ?Impulse/cycles: 2700 ?Head size: medium  ?Session: 2nd ? ?Patient did tolerate procedure well. ? ? ? ?ASSESSMENT & PLAN:  ? ?Tarsal coalition of right foot ?Completed shockwave therapy.  ? ? ? ? ?

## 2022-04-17 ENCOUNTER — Ambulatory Visit: Payer: Worker's Compensation | Admitting: Physical Therapy

## 2022-04-17 ENCOUNTER — Encounter: Payer: Self-pay | Admitting: Physical Therapy

## 2022-04-17 DIAGNOSIS — M25571 Pain in right ankle and joints of right foot: Secondary | ICD-10-CM | POA: Diagnosis present

## 2022-04-17 DIAGNOSIS — R2689 Other abnormalities of gait and mobility: Secondary | ICD-10-CM

## 2022-04-17 DIAGNOSIS — M25671 Stiffness of right ankle, not elsewhere classified: Secondary | ICD-10-CM

## 2022-04-17 DIAGNOSIS — R600 Localized edema: Secondary | ICD-10-CM

## 2022-04-17 DIAGNOSIS — M6281 Muscle weakness (generalized): Secondary | ICD-10-CM | POA: Diagnosis present

## 2022-04-17 NOTE — Therapy (Addendum)
[facility] [address]  [place], [place], [address] Phone: [phone]   Fax:  [phone]  Physical Therapy Treatment / Recert / Discharge Summary  Patient Details  Name: Heidi David MRN: [id number] Date of Birth: March 22, 1996 Referring Provider (PT): Clearance Coots, MD   Encounter Date: 04/17/2022   PT End of Session - 04/17/22 1102     Visit Number 7    Date for PT Re-Evaluation 05/29/22    Authorization Type WC    Authorization Time Period 2x/wk x 6 wks    PT Start Time 1102    PT Stop Time 1144    PT Time Calculation (min) 42 min    Activity Tolerance Patient tolerated treatment well    Behavior During Therapy [facility] for tasks assessed/performed             Past Medical History:  Diagnosis Date   Medical history non-contributory     Past Surgical History:  Procedure Laterality Date   TONSILLECTOMY     tonsils and adenoids   WISDOM TOOTH EXTRACTION      There were no vitals filed for this visit.   Subjective Assessment - 04/17/22 1105     Subjective Pt requested to defer the bike today stating she had developed a blister on her L foot from walking 1.5 miles in flip-flops which has since ripped open and is tender to put weight on. She reports R ankle has been feeling better since starting the shockwave therapy at the MD office.    Pertinent History tarsal coalition    Limitations Standing;Walking    How long can you stand comfortably? 2 hrs    How long can you walk comfortably? 2 hrs    Diagnostic tests ONG:EXBMW abnormal articulation between the anterior process of the  calcaneus and lateral aspect of the navicular with subcortical  reactive marrow changes concerning for a fibro-cartilaginous  coalition. Mild subcortical reactive marrow edema in the adjacent  plantar lateral aspect of the anterior talus.    Patient Stated Goals make it stop hurting and to walk normally; get back to work     Currently in Pain? No/denies                [facility] PT Assessment - 04/17/22 1102       Assessment   Medical Diagnosis tarsal coalition of R foot; contusion of R foot    Referring Provider (PT) Clearance Coots, MD    Onset Date/Surgical Date 01/28/22    Next MD Visit 04/18/22 - shockwave treatment      Prior Function   Level of Independence Independent    Vocation Student;Full time employment    Vocation Requirements EMT and CNA    Leisure would like to get back to working out      Observation/Other Assessments   Focus on Therapeutic Outcomes (FOTO)  60 (13 point improvement from eval); predicted 73      AROM   Right Ankle Dorsiflexion 10    Right Ankle Plantar Flexion 56    Right Ankle Inversion 30    Right Ankle Eversion 20                           [facility] Adult PT Treatment/Exercise - 04/17/22 1102       Exercises   Exercises Ankle      Ankle Exercises: Aerobic   Recumbent Bike --   deferred  at pt request     Ankle Exercises: Standing   SLS 3 x 15-20"   limited tolerance in barefeet - no shoes available     Ankle Exercises: Seated   BAPS Sitting;Level 3;10 reps    BAPS Limitations R ankle DF/PF, IV/EV, CW/CCW    Other Seated Ankle Exercises 4 way ankle blue TB x 20 reps    Other Seated Ankle Exercises R toe yoga 2 x 10 with small red TB loop around toes      Ankle Exercises: Supine   T-Band Long-sitting blue TB R toe curls x 20                       PT Short Term Goals - 03/27/22 1106       PT SHORT TERM GOAL #1   Title Ind with initial HEP    Status Achieved   03/13/22   Target Date 03/08/22      PT SHORT TERM GOAL #2   Title Patient to ambulate with a normal gait pattern without increased pain in the R ankle    Status Achieved   03/27/22   Target Date 03/15/22               PT Long Term Goals - 04/17/22 1126       PT LONG TERM GOAL #1   Title Ind with advanced HEP    Status Partially Met    Target Date  05/29/22      PT LONG TERM GOAL #2   Title Patient to demonstrate R ankle DF to >= 8 degrees to normalize her gait and stairs    Status Achieved   04/17/22 - R ankle DF = 10   Target Date 05/08/22      PT LONG TERM GOAL #3   Title Pt able to climb stairs with a reciprocal gait pattern    Status Partially Met   03/27/22 - reciprocal ascent with intermittent reciprocal descent - limited by pain and limited DF ROM; 04/17/22 - unable to assess due to lack of proper footwear and blister on plantar surface of L foot   Target Date 05/29/22      PT LONG TERM GOAL #4   Title Pt to demonstrate R ankle ROM to [facility] and be able to peform standing ADLs without pain >= 90% of the time.    Status Partially Met   04/17/22 - standing and walking tolerance improved to ~2 hrs before onset of cramping/pain   Target Date 05/29/22      PT LONG TERM GOAL #5   Title Improved FOTO to 73 from 47 at intake showing functional improvement    Status On-going   03/27/22 - FOTO = 52; 04/17/22 - FOTO = 60   Target Date 05/29/22                   Plan - 04/17/22 1144     Clinical Impression Statement Heidi David reports less pain and improving ROM since starting the shockwave therapy at the MD office. New orders received from last MD visit to extend PT 2x/wk x 6 weeks. Pt requesting to defer the bike warmup and limited with performance of standing exercises today due to open blister on plantar surface of L foot from walking 1.5 miles in flip-flops as well as absence of supportive shoes during therapy session (wore flip-flops to PT session) - encouraged pt to make sure she is wearing more supportive footwear,  esp when walking long distances. R ankle ROM now essentially WFL w/o pain. Strength improving with pt able to progress resistance band to blue TB today - cues necessary to properly isolate ankle motion avoiding compensatory motions in hip/upper leg. Attempted SLS balance and proprioceptive exercises but limited tolerance  in barefoot, therefore switched to seated BAPS exercises to work on proprioception.    Rehab Potential Excellent    PT Frequency 2x / week    PT Duration 6 weeks    PT Treatment/Interventions ADLs/Self Care Home Management;Cryotherapy;Electrical Stimulation;Iontophoresis 37m/ml Dexamethasone;Moist Heat;Neuromuscular re-education;Balance training;Therapeutic exercise;Therapeutic activities;Gait training;Stair training;Patient/family education;Manual techniques;Dry needling;Passive range of motion;Taping;Vasopneumatic Device;Joint Manipulations    PT Next Visit Plan emphasize pain-free ROM, progress strength - esp PF and eversion, ROM, gait and balance. modalities prn    PT Home Exercise Plan [id number]    [id number] Agree with Plan of Care Patient             Patient will benefit from skilled therapeutic intervention in order to improve the following deficits and impairments:  Abnormal gait, Decreased range of motion, Pain, Decreased activity tolerance, Impaired flexibility, Decreased strength, Increased edema  Visit Diagnosis: Pain in right ankle and joints of right foot  Stiffness of right ankle, not elsewhere classified  Muscle weakness (generalized)  Localized edema  Other abnormalities of gait and mobility     Problem List Patient Active Problem List   Diagnosis Date Noted   Tarsal coalition of right foot 02/19/2022   Contusion of foot, right 01/29/2022    JPercival Spanish PT 04/17/2022, 12:06 PM  [facility] [address] [place] [address]Phone: [phone]  Fax:  [phone] Name: KTarsha BlandoMRN: [id number] of Birth: [birth year]  PHYSICAL THERAPY DISCHARGE SUMMARY  Visits from Start of Care: 7  Current functional level related to goals / functional outcomes:   Refer to above clinical impression for status as of last visit on 04/17/2022. Patient cancelled next 3 scheduled  visits then no showed for following visit ([facility] #3), therefore will proceed with discharge from PT for this episode per [facility] policy.   Remaining deficits:   As above. Unable to formally assess status at discharge due to failure to return.   Education / Equipment:   HEP   Patient agrees to discharge. Patient goals were partially met. Patient is being discharged due to not returning since the last visit.  JPercival Spanish PT, MPT 06/07/22, 5:44 PM  [facility] [place] [address]Phone: [phone]  Fax:  [phone]

## 2022-04-18 ENCOUNTER — Ambulatory Visit: Payer: Medicaid Other | Admitting: Family Medicine

## 2022-04-18 ENCOUNTER — Ambulatory Visit (INDEPENDENT_AMBULATORY_CARE_PROVIDER_SITE_OTHER): Payer: Worker's Compensation | Admitting: Family Medicine

## 2022-04-18 ENCOUNTER — Encounter: Payer: Self-pay | Admitting: Family Medicine

## 2022-04-18 VITALS — Ht 62.0 in

## 2022-04-18 DIAGNOSIS — Q6689 Other  specified congenital deformities of feet: Secondary | ICD-10-CM | POA: Diagnosis not present

## 2022-04-18 NOTE — Progress Notes (Signed)
?  Heidi David - 26 y.o. female MRN [id number]  Date of birth: 22-Aug-1996 ? ?SUBJECTIVE:  Including CC & ROS.  ?No chief complaint on file. ? ? ?Heidi David is a 26 y.o. female that is following up after her injury at work.  Her pain has been improving with the shockwave therapy. ? ? ? ?Review of Systems ?See HPI  ? ?HISTORY: Past Medical, Surgical, Social, and Family History Reviewed & Updated per EMR.   ?Pertinent Historical Findings include: ? ?Past Medical History:  ?Diagnosis Date  ? Medical history non-contributory   ? ? ?Past Surgical History:  ?Procedure Laterality Date  ? TONSILLECTOMY    ? tonsils and adenoids  ? WISDOM TOOTH EXTRACTION    ? ? ? ?PHYSICAL EXAM:  ?VS: Ht 5\' 2"  (1.575 m)   BMI 37.13 kg/m?  ?Physical Exam ?Gen: NAD, alert, cooperative with exam, well-appearing ?MSK:  ?Neurovascularly intact   ? ?ECSWT Note ? ?04/15/1995 ? ?Procedure: ECSWT ?Indications: right foot pain ? ?Procedure Details ?Consent: Risks of procedure as well as the alternatives and risks of each were explained to the (patient/caregiver).  Consent for procedure obtained. ?Time Out: Verified patient identification, verified procedure, site/side was marked, verified correct patient position, special equipment/implants available, medications/allergies/relevent history reviewed, required imaging and test results available.  Performed.  The area was cleaned with iodine and alcohol swabs.   ? ?The right foot was targeted for Extracorporeal shockwave therapy.  ? ?Preset: plantar fasciitis ?Power Level: 50 ?Frequency: 10 ?Impulse/cycles: 2500 ?Head size: medium  ?Session: 3rd ? ?Patient did tolerate procedure well. ? ? ? ?ASSESSMENT & PLAN:  ? ?Tarsal coalition of right foot ?Completed shockwave therapy.  ?- provided work note ? ? ? ? ?

## 2022-04-18 NOTE — Assessment & Plan Note (Signed)
Completed shockwave therapy  ?- provided work note  ?

## 2022-04-18 NOTE — Patient Instructions (Signed)
Good to see you  Please send me a message in MyChart with any questions or updates.  Please see me back in 4 weeks.   --Dr. Corrina Steffensen  

## 2022-04-24 ENCOUNTER — Ambulatory Visit: Payer: Worker's Compensation | Admitting: Physical Therapy

## 2022-04-25 ENCOUNTER — Ambulatory Visit: Payer: Worker's Compensation

## 2022-05-01 ENCOUNTER — Ambulatory Visit: Payer: Worker's Compensation | Admitting: Physical Therapy

## 2022-05-03 ENCOUNTER — Ambulatory Visit: Payer: Worker's Compensation | Attending: Family Medicine

## 2022-05-06 ENCOUNTER — Encounter: Payer: Self-pay | Admitting: Family Medicine

## 2022-05-07 ENCOUNTER — Ambulatory Visit: Payer: Worker's Compensation | Admitting: Physical Therapy

## 2022-05-10 ENCOUNTER — Ambulatory Visit: Payer: Worker's Compensation

## 2022-05-14 ENCOUNTER — Encounter: Payer: Self-pay | Admitting: Family Medicine

## 2022-05-14 ENCOUNTER — Ambulatory Visit (INDEPENDENT_AMBULATORY_CARE_PROVIDER_SITE_OTHER): Payer: Worker's Compensation | Admitting: Family Medicine

## 2022-05-14 VITALS — Ht 62.0 in

## 2022-05-14 DIAGNOSIS — Q6689 Other  specified congenital deformities of feet: Secondary | ICD-10-CM

## 2022-05-14 NOTE — Patient Instructions (Signed)
Good to see you ?Please use ice as needed   ?Please send me a message in MyChart with any questions or updates.  ?Please see me back to have orthotics made.  ? ?--Dr. Jordan Likes ? ?

## 2022-05-14 NOTE — Progress Notes (Signed)
?  Heidi David - 26 y.o. female MRN [id number]  Date of birth: 1996/09/03 ? ?SUBJECTIVE:  Including CC & ROS.  ?No chief complaint on file. ? ? ?Heidi David is a 26 y.o. female that is here for shockwave therapy. ? ? ? ?Review of Systems ?See HPI  ? ?HISTORY: Past Medical, Surgical, Social, and Family History Reviewed & Updated per EMR.   ?Pertinent Historical Findings include: ? ?Past Medical History:  ?Diagnosis Date  ? Medical history non-contributory   ? ? ?Past Surgical History:  ?Procedure Laterality Date  ? TONSILLECTOMY    ? tonsils and adenoids  ? WISDOM TOOTH EXTRACTION    ? ? ? ?PHYSICAL EXAM:  ?VS: Ht 5\' 2"  (1.575 m)   BMI 37.13 kg/m?  ?Physical Exam ?Gen: NAD, alert, cooperative with exam, well-appearing ?MSK:  ?Neurovascularly intact   ? ?ECSWT Note ? ?1996/12/28 ? ?Procedure: ECSWT ?Indications: Right foot pain ? ?Procedure Details ?Consent: Risks of procedure as well as the alternatives and risks of each were explained to the (patient/caregiver).  Consent for procedure obtained. ?Time Out: Verified patient identification, verified procedure, site/side was marked, verified correct patient position, special equipment/implants available, medications/allergies/relevent history reviewed, required imaging and test results available.  Performed.  The area was cleaned with iodine and alcohol swabs.   ? ?The right foot was targeted for Extracorporeal shockwave therapy.  ? ?Preset: Planter fasciitis ?Power Level: 50 ?Frequency: 10 ?Impulse/cycles: 2500 ?Head size: Medium ?Session: 4 ? ?Patient did tolerate procedure well. ? ? ? ?ASSESSMENT & PLAN:  ? ?Tarsal coalition of right foot ?Still having pain stemming from her injury at work.  We have tried shockwave therapy as well as physical therapy. ?-Counseled on home exercise therapy and supportive care. ?-We will pursue custom orthotics that we build in office. ?-Provided work note. ? ? ? ? ?

## 2022-05-14 NOTE — Assessment & Plan Note (Signed)
Still having pain stemming from her injury at work.  We have tried shockwave therapy as well as physical therapy. ?-Counseled on home exercise therapy and supportive care. ?-We will pursue custom orthotics that we build in office. ?-Provided work note. ?

## 2022-05-15 ENCOUNTER — Encounter: Payer: Medicaid Other | Admitting: Physical Therapy

## 2022-05-22 ENCOUNTER — Encounter: Payer: Medicaid Other | Admitting: Physical Therapy

## 2022-05-24 ENCOUNTER — Encounter: Payer: Medicaid Other | Admitting: Physical Therapy

## 2022-06-13 ENCOUNTER — Encounter: Payer: Medicaid Other | Admitting: Family Medicine

## 2022-06-18 ENCOUNTER — Ambulatory Visit (INDEPENDENT_AMBULATORY_CARE_PROVIDER_SITE_OTHER): Payer: Worker's Compensation | Admitting: Family Medicine

## 2022-06-18 ENCOUNTER — Encounter: Payer: Self-pay | Admitting: Family Medicine

## 2022-06-18 DIAGNOSIS — Q6689 Other  specified congenital deformities of feet: Secondary | ICD-10-CM

## 2022-06-18 NOTE — Progress Notes (Signed)
  Heidi David - 26 y.o. female MRN [id number]  Date of birth: [birth year]  SUBJECTIVE:  Including CC & ROS.  No chief complaint on file.   Heidi David is a 26 y.o. female that is here for orthotics.    Review of Systems See HPI   HISTORY: Past Medical, Surgical, Social, and Family History Reviewed & Updated per EMR.   Pertinent Historical Findings include:  Past Medical History:  Diagnosis Date   Medical history non-contributory     Past Surgical History:  Procedure Laterality Date   TONSILLECTOMY     tonsils and adenoids   WISDOM TOOTH EXTRACTION       PHYSICAL EXAM:  VS: BP 122/78   Ht 5\' 2"  (1.575 m)   Wt 211 lb 9.6 oz (96 kg)   BMI 38.70 kg/m  Physical Exam Gen: NAD, alert, cooperative with exam, well-appearing MSK:  Neurovascularly intact    Patient was fitted for a standard, cushioned, semi-rigid orthotic. The orthotic was heated and afterward the patient stood on the orthotic blank positioned on the orthotic stand. The patient was positioned in subtalar neutral position and 10 degrees of ankle dorsiflexion in a weight bearing stance. After completion of molding, a stable base was applied to the orthotic blank. The blank was ground to a stable position for weight bearing. Size: 56F Pairs: 2 Base: Blue EVA Additional Posting and Padding: None The patient ambulated these, and they were very comfortable.    ASSESSMENT & PLAN:   Tarsal coalition of right foot Completed orthotics after an injury occurred at work.

## 2022-06-18 NOTE — Assessment & Plan Note (Signed)
Completed orthotics after an injury occurred at work.

## 2022-07-05 ENCOUNTER — Ambulatory Visit (INDEPENDENT_AMBULATORY_CARE_PROVIDER_SITE_OTHER): Payer: Worker's Compensation | Admitting: Family Medicine

## 2022-07-05 ENCOUNTER — Encounter: Payer: Self-pay | Admitting: Family Medicine

## 2022-07-05 VITALS — BP 126/62 | Ht 62.0 in | Wt 210.0 lb

## 2022-07-05 DIAGNOSIS — Q6689 Other  specified congenital deformities of feet: Secondary | ICD-10-CM | POA: Diagnosis not present

## 2022-07-05 NOTE — Assessment & Plan Note (Signed)
Continues to have pain after an injury sustained at work.  We have tried physical therapy and orthotics with ongoing pain. -Counseled on home exercise therapy and supportive care. -Try custom orthotics by [facility]. -Referral to orthopedist -Could consider injection going forward.

## 2022-07-05 NOTE — Progress Notes (Signed)
  Heidi David - 26 y.o. female MRN [id number]  Date of birth: 30-Mar-1996  SUBJECTIVE:  Including CC & ROS.  No chief complaint on file.   Heidi David is a 26 y.o. female that is following up for right foot pain after an injury she sustained at work.  Continues to have pain after the orthotics.    Review of Systems See HPI   HISTORY: Past Medical, Surgical, Social, and Family History Reviewed & Updated per EMR.   Pertinent Historical Findings include:  Past Medical History:  Diagnosis Date   Medical history non-contributory     Past Surgical History:  Procedure Laterality Date   TONSILLECTOMY     tonsils and adenoids   WISDOM TOOTH EXTRACTION       PHYSICAL EXAM:  VS: BP 126/62 (BP Location: Right Arm, Patient Position: Sitting, Cuff Size: Normal)   Ht 5\' 2"  (1.575 m)   Wt 210 lb (95.3 kg)   BMI 38.41 kg/m  Physical Exam Gen: NAD, alert, cooperative with exam, well-appearing MSK:  Neurovascularly intact       ASSESSMENT & PLAN:   Tarsal coalition of right foot Continues to have pain after an injury sustained at work.  We have tried physical therapy and orthotics with ongoing pain. -Counseled on home exercise therapy and supportive care. -Try custom orthotics by [facility]. -Referral to orthopedist -Could consider injection going forward.

## 2022-07-05 NOTE — Patient Instructions (Signed)
Good to see you You can try the new orthotics  I have made a referral or we can do an injection going forward   Please send me a message in MyChart with any questions or updates.  Please see me back in 4-6 weeks or as needed if taking the referral.   --Dr. Jordan Likes

## 2022-07-26 ENCOUNTER — Encounter: Payer: Self-pay | Admitting: Family Medicine

## 2022-11-19 ENCOUNTER — Encounter: Payer: Self-pay | Admitting: Family Medicine

## 2022-11-19 ENCOUNTER — Ambulatory Visit (INDEPENDENT_AMBULATORY_CARE_PROVIDER_SITE_OTHER): Payer: Worker's Compensation | Admitting: Family Medicine

## 2022-11-19 VITALS — BP 118/70 | Ht 62.0 in | Wt 195.0 lb

## 2022-11-19 DIAGNOSIS — Q6689 Other  specified congenital deformities of feet: Secondary | ICD-10-CM

## 2022-11-19 NOTE — Patient Instructions (Signed)
Good to see you Please use ice as needed  Please send me a message in MyChart with any questions or updates.  Please see me back as needed.   --Dr. Dresean Beckel  

## 2022-11-19 NOTE — Assessment & Plan Note (Signed)
Initial injury occurred at work on 1/21 while at work.  Pain occurs intermittently.  Orthotics does not seem to help at this point. -Counseled on home exercise therapy and supportive care. -Provided work note.

## 2022-11-19 NOTE — Progress Notes (Signed)
  Heidi David - 26 y.o. female MRN [id number]  Date of birth: 1996-04-21  SUBJECTIVE:  Including CC & ROS.  No chief complaint on file.   Heidi David is a 26 y.o. female that is following up for her injury at work.  Continues to have pain intermittently.  Has tried physical therapy, shockwave therapy and orthotics.   Review of Systems See HPI   HISTORY: Past Medical, Surgical, Social, and Family History Reviewed & Updated per EMR.   Pertinent Historical Findings include:  Past Medical History:  Diagnosis Date   Medical history non-contributory     Past Surgical History:  Procedure Laterality Date   TONSILLECTOMY     tonsils and adenoids   WISDOM TOOTH EXTRACTION       PHYSICAL EXAM:  VS: BP 118/70   Ht 5\' 2"  (1.575 m)   Wt 195 lb (88.5 kg)   BMI 35.67 kg/m  Physical Exam Gen: NAD, alert, cooperative with exam, well-appearing MSK:  Neurovascularly intact       ASSESSMENT & PLAN:   Tarsal coalition of right foot Initial injury occurred at work on 1/21 while at work.  Pain occurs intermittently.  Orthotics does not seem to help at this point. -Counseled on home exercise therapy and supportive care. -Provided work note.

## 2023-04-07 IMAGING — DX DG ANKLE COMPLETE 3+V*R*
3 series · 3 of 3 positions shown · non-contrast
Comparison: None.

CLINICAL DATA: Lateral ankle pain and swelling after fall.

EXAM:
RIGHT ANKLE - COMPLETE 3+ VIEW

[ankle ap]
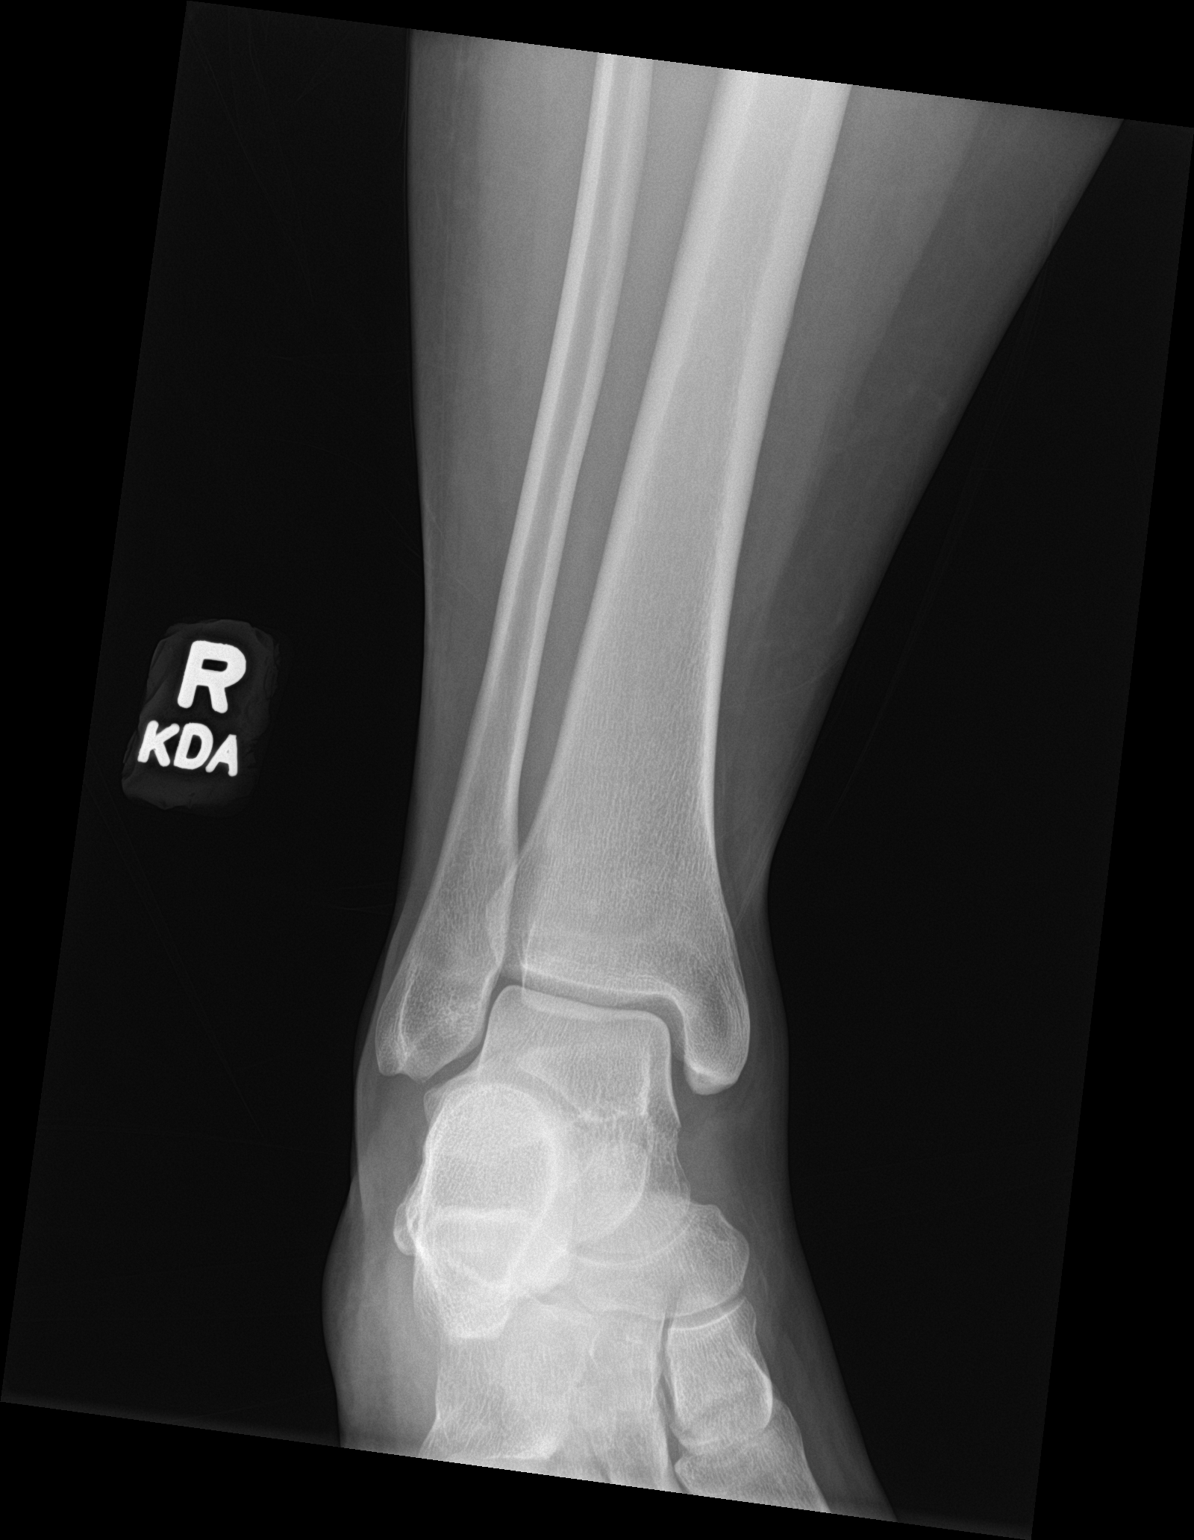

[ankle obl]
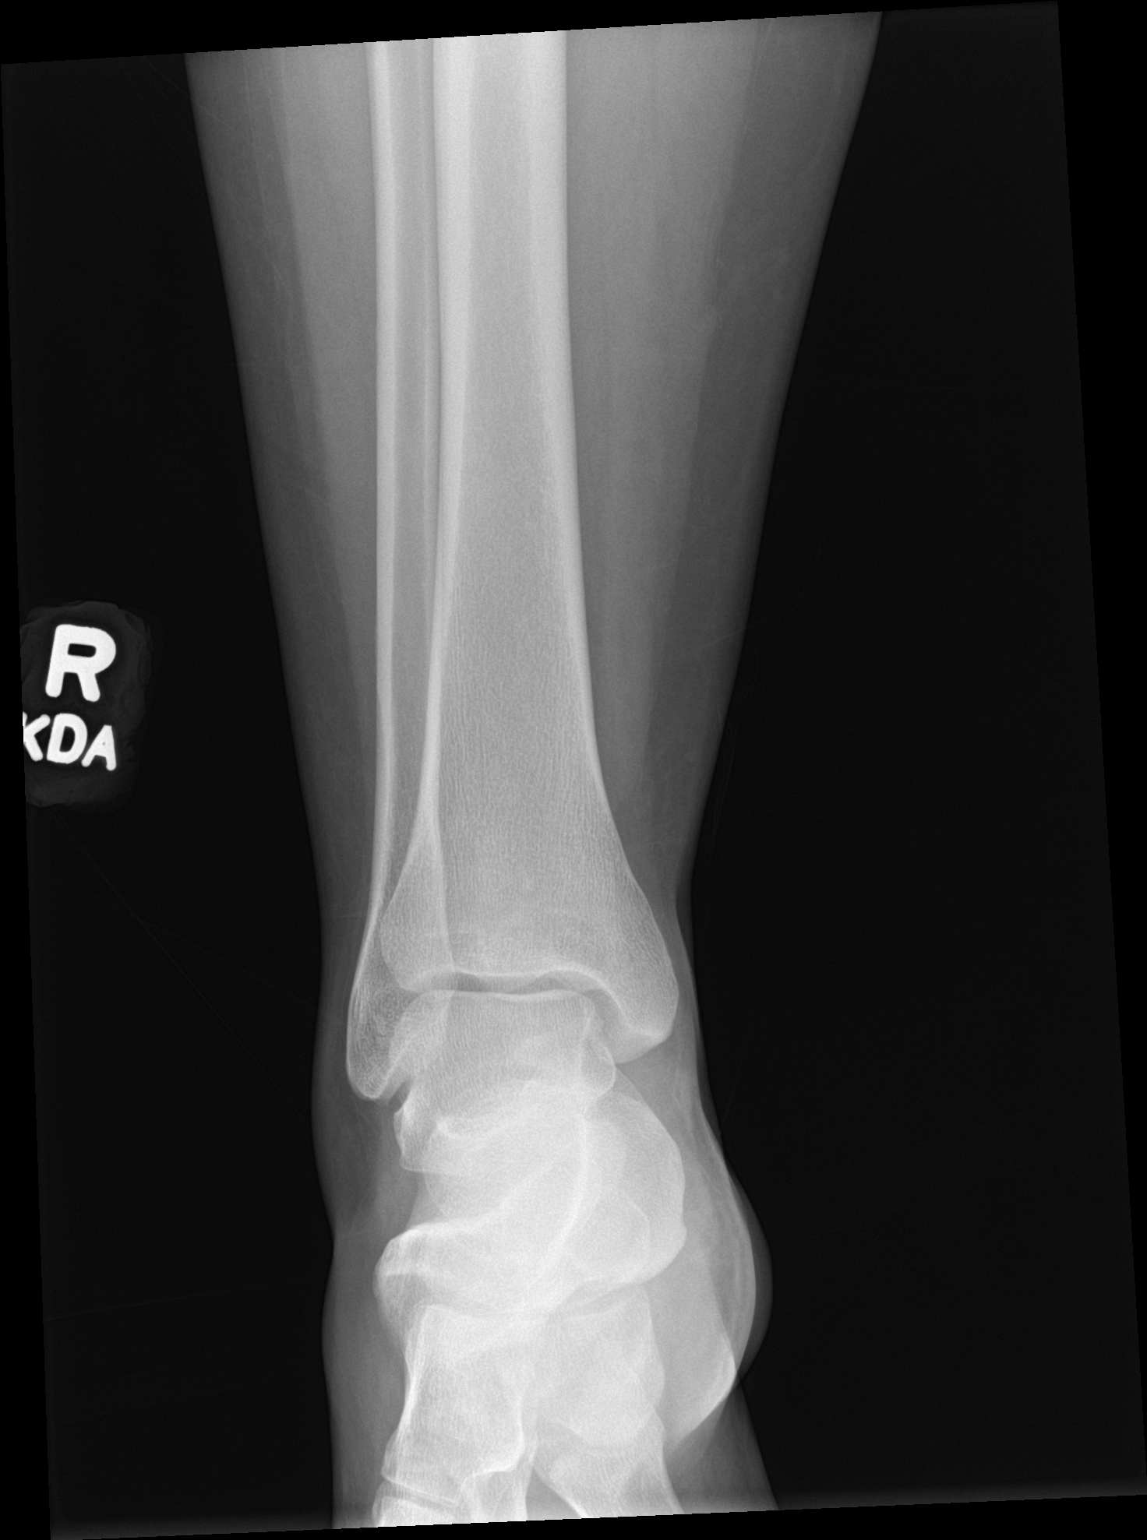

[ankle lat]
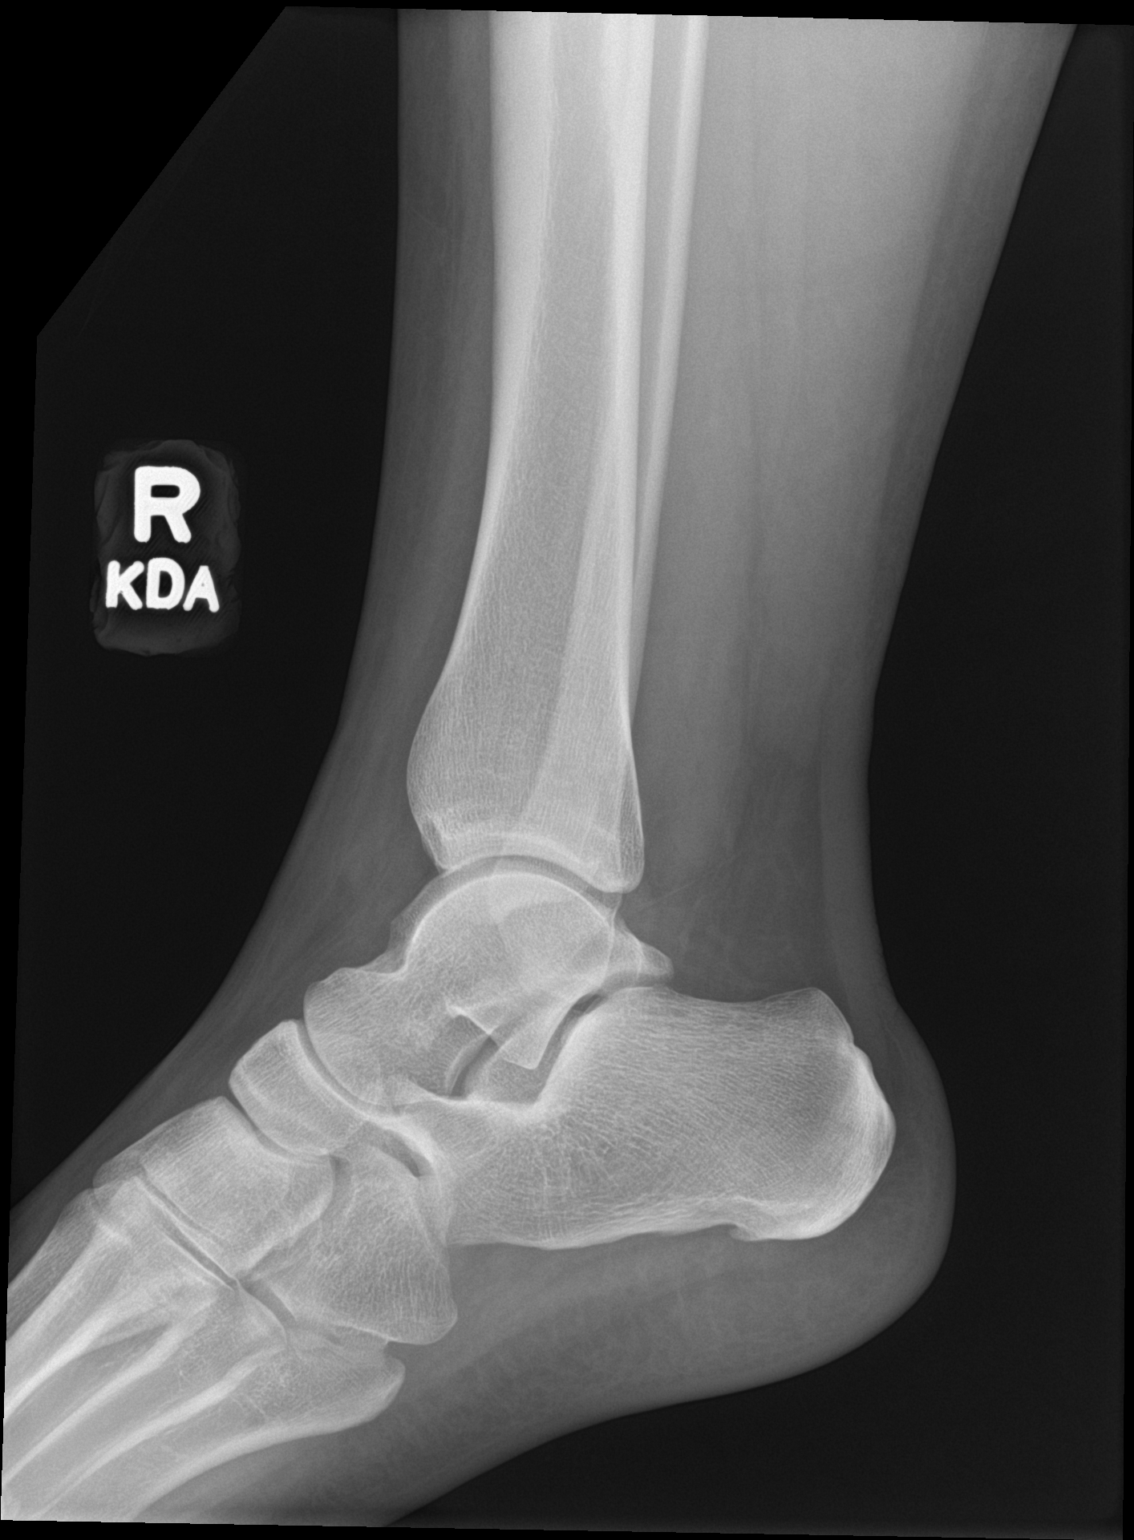

[3 of 3 positions shown; findings below may reference images not displayed]

FINDINGS: There is no evidence of fracture or dislocation. Small joint
effusion. There is no evidence of arthropathy or other focal bone
abnormality. Soft tissues are unremarkable.
IMPRESSION: No fracture or dislocation of the right ankle. Small joint effusion.

## 2023-04-07 IMAGING — DX DG FOOT COMPLETE 3+V*R*
3 series · 3 of 3 positions shown · non-contrast
Comparison: None.

CLINICAL DATA: Lateral foot/ankle pain and swelling after fall.

EXAM:
RIGHT FOOT COMPLETE - 3+ VIEW

[foot ap]
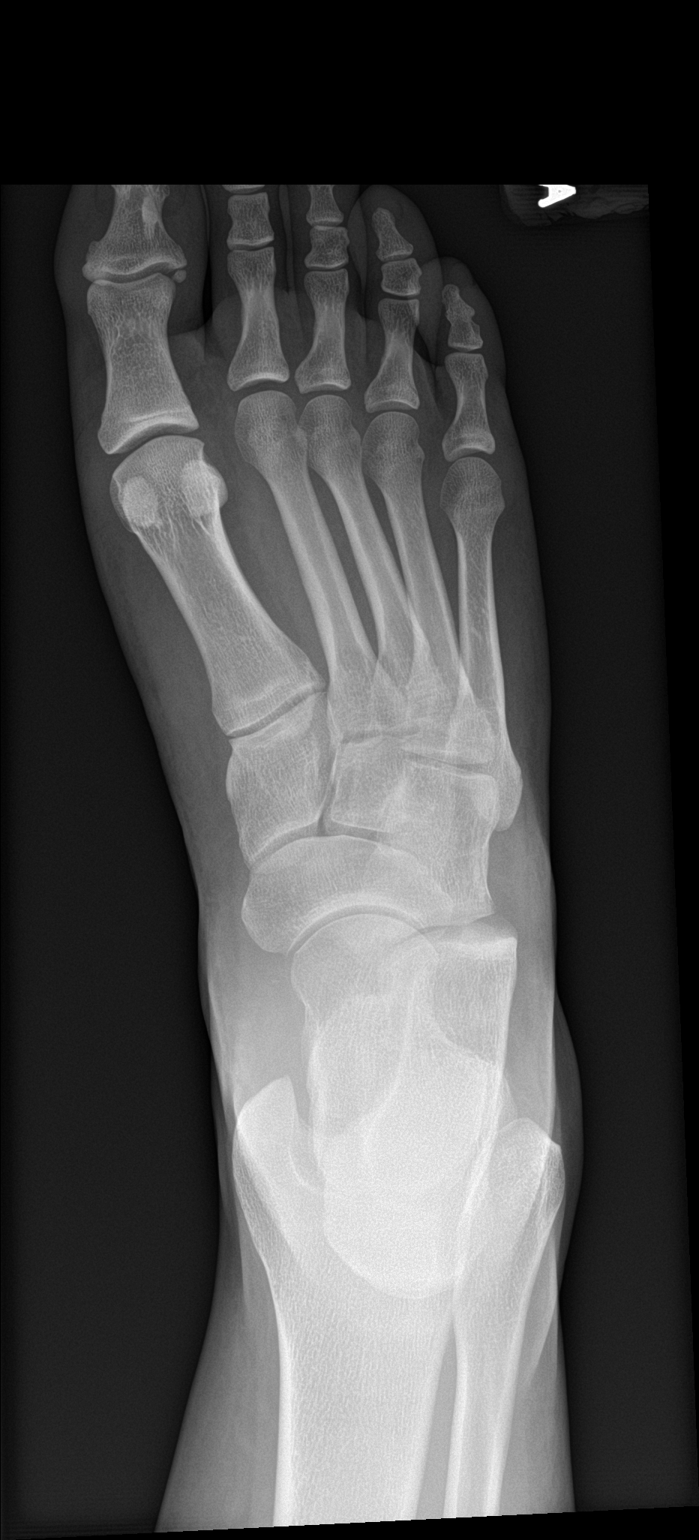

[foot obl]
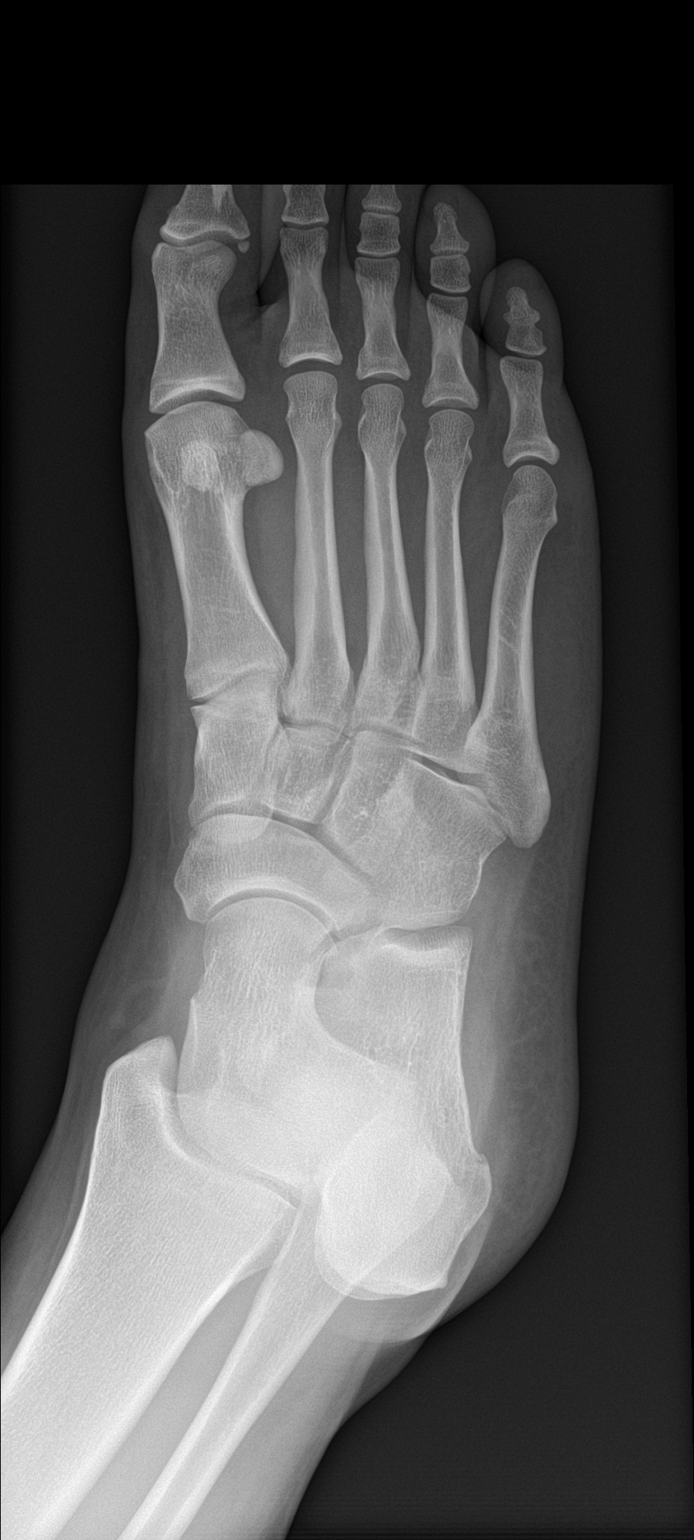

[foot lat]
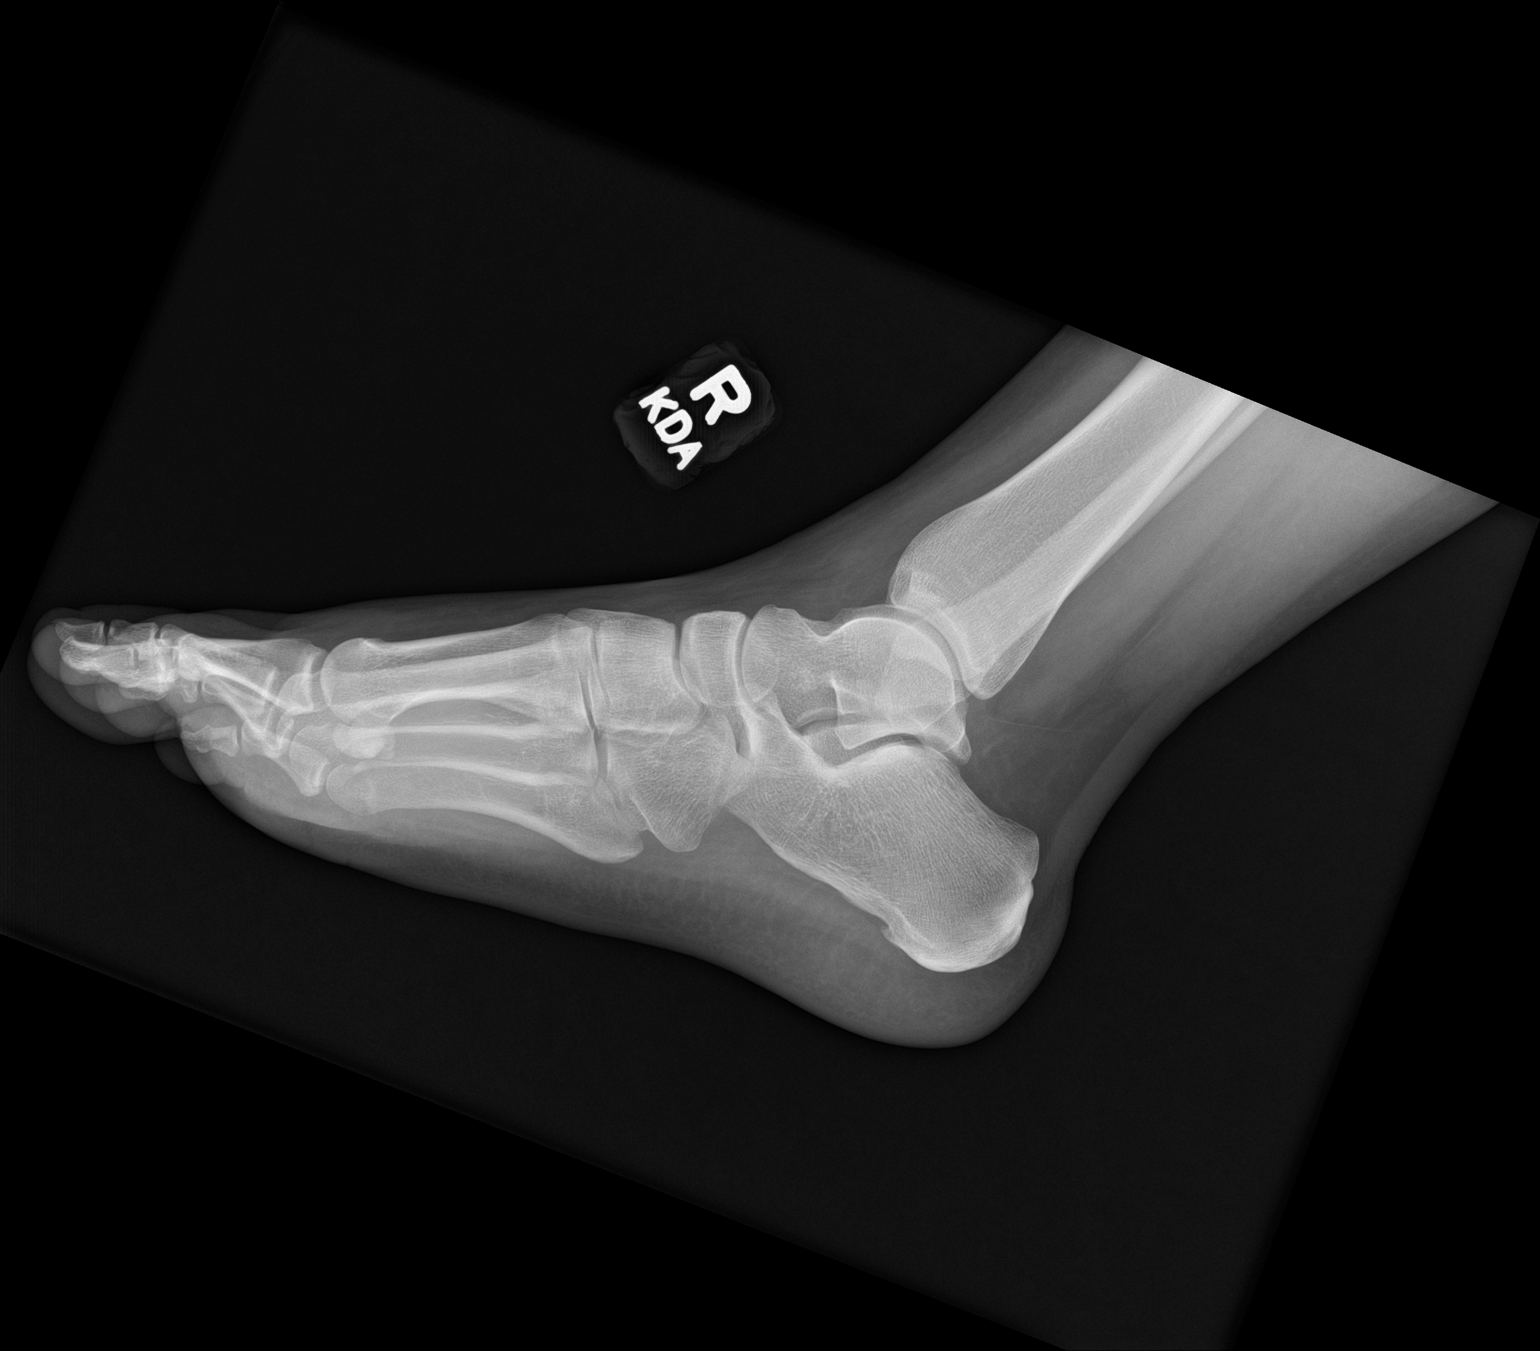

[3 of 3 positions shown; findings below may reference images not displayed]

FINDINGS: There is no evidence of fracture or dislocation. There is no
evidence of arthropathy or other focal bone abnormality. Soft
tissues are unremarkable.
IMPRESSION: No acute osseous abnormality.

## 2023-04-15 ENCOUNTER — Encounter: Payer: Self-pay | Admitting: *Deleted

## 2023-04-24 ENCOUNTER — Emergency Department (HOSPITAL_BASED_OUTPATIENT_CLINIC_OR_DEPARTMENT_OTHER)
Admission: EM | Admit: 2023-04-24 | Discharge: 2023-04-24 | Disposition: A | Discharge status: Home / Self Care | Payer: Medicaid Other | Attending: Emergency Medicine | Admitting: Emergency Medicine

## 2023-04-24 ENCOUNTER — Emergency Department (HOSPITAL_BASED_OUTPATIENT_CLINIC_OR_DEPARTMENT_OTHER): Payer: Medicaid Other

## 2023-04-24 ENCOUNTER — Encounter (HOSPITAL_BASED_OUTPATIENT_CLINIC_OR_DEPARTMENT_OTHER): Payer: Self-pay

## 2023-04-24 DIAGNOSIS — R1013 Epigastric pain: Secondary | ICD-10-CM | POA: Insufficient documentation

## 2023-04-24 DIAGNOSIS — M545 Low back pain, unspecified: Secondary | ICD-10-CM | POA: Diagnosis not present

## 2023-04-24 DIAGNOSIS — R1011 Right upper quadrant pain: Secondary | ICD-10-CM | POA: Insufficient documentation

## 2023-04-24 DIAGNOSIS — R109 Unspecified abdominal pain: Secondary | ICD-10-CM

## 2023-04-24 LAB — COMPREHENSIVE METABOLIC PANEL
ALT: 16 U/L (ref 0–44)
AST: 20 U/L (ref 15–41)
Albumin: 4 g/dL (ref 3.5–5.0)
Alkaline Phosphatase: 79 U/L (ref 38–126)
Anion gap: 7 (ref 5–15)
BUN: 15 mg/dL (ref 6–20)
CO2: 23 mmol/L (ref 22–32)
Calcium: 8.6 mg/dL — ABNORMAL LOW (ref 8.9–10.3)
Chloride: 103 mmol/L (ref 98–111)
Creatinine, Ser: 0.66 mg/dL (ref 0.44–1.00)
GFR, Estimated: >60 mL/min (ref >60)
Glucose, Bld: 108 mg/dL — ABNORMAL HIGH (ref 70–99)
Potassium: 4.2 mmol/L (ref 3.5–5.1)
Sodium: 133 mmol/L — ABNORMAL LOW (ref 135–145)
Total Bilirubin: 0.3 mg/dL (ref 0.3–1.2)
Total Protein: 7.8 g/dL (ref 6.5–8.1)

## 2023-04-24 LAB — CBC
HCT: 41.5 % (ref 36.0–46.0)
Hemoglobin: 14.2 g/dL (ref 12.0–15.0)
MCH: 29.7 pg (ref 26.0–34.0)
MCHC: 34.2 g/dL (ref 30.0–36.0)
MCV: 86.8 fL (ref 80.0–100.0)
Platelets: 315 10*3/uL (ref 150–400)
RBC: 4.78 MIL/uL (ref 3.87–5.11)
RDW: 12.7 % (ref 11.5–15.5)
WBC: 8.7 10*3/uL (ref 4.0–10.5)
nRBC: 0 % (ref 0.0–0.2)

## 2023-04-24 LAB — URINALYSIS, ROUTINE W REFLEX MICROSCOPIC
Bilirubin Urine: NEGATIVE
Glucose, UA: NEGATIVE mg/dL
Hgb urine dipstick: NEGATIVE
Ketones, ur: NEGATIVE mg/dL
Leukocytes,Ua: NEGATIVE
Nitrite: NEGATIVE
Protein, ur: NEGATIVE mg/dL
Specific Gravity, Urine: 1.02 (ref 1.005–1.030)
pH: 6 (ref 5.0–8.0)

## 2023-04-24 LAB — LIPASE, BLOOD: Lipase: 29 U/L (ref 11–51)

## 2023-04-24 LAB — PREGNANCY, URINE: Preg Test, Ur: NEGATIVE

## 2023-04-24 MED ORDER — PANTOPRAZOLE SODIUM 40 MG PO TBEC: 40.0000 mg | DELAYED_RELEASE_TABLET | Freq: Every day | ORAL | 0 refills | Status: AC

## 2023-04-24 MED ORDER — DICYCLOMINE HCL 20 MG PO TABS: 20.0000 mg | ORAL_TABLET | Freq: Two times a day (BID) | ORAL | 0 refills | Status: AC

## 2023-04-24 MED ORDER — ACETAMINOPHEN 500 MG PO TABS
1000.0000 mg | ORAL_TABLET | Freq: Once | ORAL | Status: AC
  Administered 2023-04-24: 1000 mg via ORAL
  Filled 2023-04-24: qty 2

## 2023-04-24 MED ORDER — IOHEXOL 300 MG/ML  SOLN
100.0000 mL | Freq: Once | INTRAMUSCULAR | Status: AC | PRN
  Administered 2023-04-24: 100 mL via INTRAVENOUS

## 2023-04-24 NOTE — ED Triage Notes (Signed)
C/o right abdominal pain radiating to right flank and down to buttocks x 2 days. Pain worsened, and is worse with deep breathing.

## 2023-04-24 NOTE — Discharge Instructions (Addendum)
Seen today for right flank and right upper abdominal pain, labs and CT scan were all reassuring.  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  Follow-up with your primary care doctor if you have having symptoms specially the pain after eating follow-up with GI.  You are given an antispasm medicine and an antacid medicine to see if this improves your symptoms.

## 2023-04-24 NOTE — ED Provider Notes (Signed)
[facility] Provider Note   CSN: [id number] Arrival date & time: 04/24/23  1234     History  Chief Complaint  Patient presents with   Flank Pain    Heidi David is a 27 y.o. female.  She is PMH of peptic ulcer disease in the past as well as anxiety and PTSD.  She reports 2 days of right flank and right upper quadrant pain.  Seem to start in the right flank and radiates into right upper abdomen.  She reports she also has pain in the right low back that shoots up to her mid back but states this is chronic.  Patient is injury, no fevers or chills, no nausea or vomiting.  Denies any chest pain or shortness of breath or other complaints.   Flank Pain       Home Medications Prior to Admission medications   Medication Sig Start Date End Date Taking? Authorizing Provider  dicyclomine (BENTYL) 20 MG tablet Take 1 tablet (20 mg total) by mouth 2 (two) times daily. 04/24/23  Yes Gilmore List A, PA-C  pantoprazole (PROTONIX) 40 MG tablet Take 1 tablet (40 mg total) by mouth daily. 04/24/23  Yes Donnel Venuto A, PA-C  hydrOXYzine (ATARAX/VISTARIL) 25 MG tablet Take 0.5-1 tablets (12.5-25 mg total) by mouth at bedtime as needed. Patient taking differently: Take 12.5 mg by mouth at bedtime as needed for anxiety. 11/17/19   Terrilee Files, MD  lidocaine (LIDODERM) 5 % Place 1 patch onto the skin daily. Remove & Discard patch within 12 hours or as directed by MD Patient not taking: Reported on 02/22/2022 01/28/22   Henderly, Britni A, PA-C  meloxicam (MOBIC) 7.5 MG tablet Take 1 tablet (7.5 mg total) by mouth daily as needed. 02/13/22   Myra Rude, MD  silver sulfADIAZINE (SILVADENE) 1 % cream Apply 1 application topically daily. 01/17/19   Charlynne Pander, MD      Allergies    Codeine, Hydrocodone, and Oxycodone    Review of Systems   Review of Systems  Genitourinary:  Positive for flank pain.    Physical Exam Updated Vital  Signs BP 118/80   Pulse 67   Temp 98.5 F (36.9 C) (Oral)   Resp 16   Ht  (1.575 m)   Wt 89.8 kg   LMP 04/11/2023 (Approximate)   SpO2 99%   BMI 36.21 kg/m  Physical Exam Vitals and nursing note reviewed.  Constitutional:      General: She is not in acute distress.    Appearance: She is well-developed.  HENT:     Head: Normocephalic and atraumatic.  Eyes:     Conjunctiva/sclera: Conjunctivae normal.  Cardiovascular:     Rate and Rhythm: Normal rate and regular rhythm.     Heart sounds: No murmur heard. Pulmonary:     Effort: Pulmonary effort is normal. No respiratory distress.     Breath sounds: Normal breath sounds.  Abdominal:     Palpations: Abdomen is soft.     Tenderness: There is abdominal tenderness in the right upper quadrant and epigastric area. There is right CVA tenderness. There is no left CVA tenderness. Negative signs include Murphy's sign, Rovsing's sign and McBurney's sign.  Musculoskeletal:        General: No swelling.     Cervical back: Neck supple.  Skin:    General: Skin is warm and dry.     Capillary Refill: Capillary refill takes less than 2  seconds.     Findings: No erythema or rash.  Neurological:     General: No focal deficit present.     Mental Status: She is alert and oriented to person, place, and time.  Psychiatric:        Mood and Affect: Mood normal.     ED Results / Procedures / Treatments   Labs (all labs ordered are listed, but only abnormal results are displayed) Labs Reviewed  COMPREHENSIVE METABOLIC PANEL - Abnormal; Notable for the following components:      Result Value   Sodium 133 (*)    Glucose, Bld 108 (*)    Calcium 8.6 (*)    All other components within normal limits  LIPASE, BLOOD  CBC  URINALYSIS, ROUTINE W REFLEX MICROSCOPIC  PREGNANCY, URINE    EKG None  Radiology CT ABDOMEN PELVIS W CONTRAST  Result Date: 04/24/2023 CLINICAL DATA:  Abdominal pain, acute, nonlocalized. Pain radiates to the right  flank and buttock over the last 2 days. EXAM: CT ABDOMEN AND PELVIS WITH CONTRAST TECHNIQUE: Multidetector CT imaging of the abdomen and pelvis was performed using the standard protocol following bolus administration of intravenous contrast. RADIATION DOSE REDUCTION: This exam was performed according to the departmental dose-optimization program which includes automated exposure control, adjustment of the mA and/or kV according to patient size and/or use of iterative reconstruction technique. CONTRAST:  OMNIPAQUE IOHEXOL 300 MG/ML  SOLN COMPARISON:  12/17/2016 FINDINGS: Lower chest: Normal Hepatobiliary: Liver parenchyma is normal.  No calcified gallstones. Pancreas: Normal Spleen: Normal Adrenals/Urinary Tract: Adrenal glands are normal. Kidneys are normal. No mass, hemorrhage, hydrocephalus or extra-axial collection. Chronic simple cyst measuring 1 cm at the lower pole on the right. No follow-up recommended. Bladder is normal. Stomach/Bowel: Stomach and small intestine are normal. No abnormal appendix. Colon is normal. Vascular/Lymphatic: Aorta and IVC are normal.  No adenopathy. Reproductive: Normal appearing uterus. The right ovary is not identified. No pathologic finding relating to the left ovary. Other: Tiny amount of free fluid in the pelvic cul-de-sac, often seen in young females. No worrisome finding. Musculoskeletal: Chronic degenerative change of the L5-S1 disc. IMPRESSION: 1. No abnormality seen to explain the clinical presentation. No evidence of urinary tract stone disease or other urinary tract pathology. 2. Chronic degenerative change of the L5-S1 disc. 3. Tiny amount of free fluid in the pelvic cul-de-sac, often seen in young females. Electronically Signed   By: Paulina Fusi M.D.   On: 04/24/2023 16:53    Procedures Procedures    Medications Ordered in ED Medications  acetaminophen (TYLENOL) tablet 1,000 mg (1,000 mg Oral Given 04/24/23 1614)  iohexol (OMNIPAQUE) 300 MG/ML solution  100 mL (100 mLs Intravenous Contrast Given 04/24/23 1635)    ED Course/ Medical Decision Making/ A&P Clinical Course as of 04/24/23 1741  Wed Apr 24, 2023  1608 [place] right upper quadrant pain and tenderness for the past 2 days.  She did have noticed the past, no history of abdominal surgeries.  Labs are reassuring, she has some right upper quadrant tenderness but negative Murphy sign, mild right CVA tenderness as well but urinalysis is normal.  Offered watchful waiting and pain control versus imaging and patient would very much like to proceed with imaging. [CB]    Clinical Course User Index [CB] Ma Rings, PA-C                             Medical Decision Making This  patient presents to the ED for concern of right flank and right upper quadrant abdominal pain going on for the past several days.  Starts in the right upper back, goes to the right upper abdomen, this involves an extensive number of treatment options, and is a complaint that carries with it a high risk of complications and morbidity.  The differential diagnosis includes gastritis, gastroenteritis, renal colic, appendicitis, cholecystitis, diverticulitis, DKA, nephrolithiasis, gastroparesis, other    Co morbidities that complicate the patient evaluation  history of gastric ulcers   Additional history obtained:  Additional history obtained from EMR External records from outside source obtained and reviewed including prior ED visits   Lab Tests:  I Ordered, and personally interpreted labs.  The pertinent results include: BC, CMP, lipase urinalysis and pregnancy test all reassuring.  Patient is not pregnant.   Imaging Studies ordered:  I ordered imaging studies including CT abdomen pelvis I independently visualized and interpreted imaging which showed no acute intra-abdominal pathology I agree with the radiologist interpretation     Problem List / ED Course / Critical interventions / Medication  management  Patient coming in with right flank pain, she has chronic low back pain that she states is unchanged but has been having pain in the right upper back and right upper quadrant of abdomen, she is some mild upper abdominal tenderness but negative Murphy sign, no rebound guarding or rigidity.  She is very reassuring vitals and labs.  Discussed imaging versus outpatient follow-up and patient very much wanted imaging today.  This was done, it is negative.  I considered possible early zoster but patient has no rash, no hyperesthesia and states she was vaccinated against chickenpox and never had the disease.  I feel this is extremely unlikely .  Initially she did not feel symptoms were postprandial but then after further discussion she states about think back it does seem to get a lot worse after eating, discussed that could be functional gallbladder disease versus return of her ulcers, we will treat her with Bentyl and a PPI and have her follow-up with GI, she has never had diascopy or GI follow-up, was just treated empirically for possible peptic ulcer.  She is agreeable plan of care and discharge instructions, given strict return precautions. I ordered medication including Tylenol for pain Reevaluation of the patient after these medicines showed that the patient improved I have reviewed the patients home medicines and have made adjustments as needed     Amount and/or Complexity of Data Reviewed Labs: ordered. Radiology: ordered.  Risk OTC drugs. Prescription drug management.           Final Clinical Impression(s) / ED Diagnoses Final diagnoses:  Right flank pain    Rx / DC Orders ED Discharge Orders          Ordered    dicyclomine (BENTYL) 20 MG tablet  2 times daily        04/24/23 1721    pantoprazole (PROTONIX) 40 MG tablet  Daily        04/24/23 [address] 04/24/23 1741    Tegeler, Canary Brim, MD 04/24/23 1819

## 2023-04-24 NOTE — ED Notes (Signed)

## 2023-04-24 NOTE — ED Notes (Signed)
Pt endorsed taking Ibuprofen at 1030hrs.

## 2023-11-21 ENCOUNTER — Encounter (HOSPITAL_BASED_OUTPATIENT_CLINIC_OR_DEPARTMENT_OTHER): Payer: Self-pay | Admitting: Emergency Medicine

## 2023-11-21 ENCOUNTER — Other Ambulatory Visit: Payer: Self-pay

## 2023-11-21 ENCOUNTER — Emergency Department (HOSPITAL_BASED_OUTPATIENT_CLINIC_OR_DEPARTMENT_OTHER): Payer: Medicaid Other

## 2023-11-21 ENCOUNTER — Emergency Department (HOSPITAL_BASED_OUTPATIENT_CLINIC_OR_DEPARTMENT_OTHER)
Admission: EM | Admit: 2023-11-21 | Discharge: 2023-11-21 | Disposition: A | Discharge status: Home / Self Care | Payer: Medicaid Other | Attending: Emergency Medicine | Admitting: Emergency Medicine

## 2023-11-21 DIAGNOSIS — R42 Dizziness and giddiness: Secondary | ICD-10-CM | POA: Diagnosis not present

## 2023-11-21 DIAGNOSIS — R0602 Shortness of breath: Secondary | ICD-10-CM | POA: Diagnosis not present

## 2023-11-21 DIAGNOSIS — R110 Nausea: Secondary | ICD-10-CM | POA: Insufficient documentation

## 2023-11-21 DIAGNOSIS — R002 Palpitations: Secondary | ICD-10-CM | POA: Insufficient documentation

## 2023-11-21 LAB — CBC WITH DIFFERENTIAL/PLATELET
Abs Immature Granulocytes: 0.03 10*3/uL (ref 0.00–0.07)
Basophils Absolute: 0.1 10*3/uL (ref 0.0–0.1)
Basophils Relative: 1 %
Eosinophils Absolute: 0.2 10*3/uL (ref 0.0–0.5)
Eosinophils Relative: 3 %
HCT: 41.3 % (ref 36.0–46.0)
Hemoglobin: 13.9 g/dL (ref 12.0–15.0)
Immature Granulocytes: 0 %
Lymphocytes Relative: 33 %
Lymphs Abs: 2.5 10*3/uL (ref 0.7–4.0)
MCH: 29.6 pg (ref 26.0–34.0)
MCHC: 33.7 g/dL (ref 30.0–36.0)
MCV: 87.9 fL (ref 80.0–100.0)
Monocytes Absolute: 0.7 10*3/uL (ref 0.1–1.0)
Monocytes Relative: 9 %
Neutro Abs: 4 10*3/uL (ref 1.7–7.7)
Neutrophils Relative %: 54 %
Platelets: 334 10*3/uL (ref 150–400)
RBC: 4.7 MIL/uL (ref 3.87–5.11)
RDW: 12.8 % (ref 11.5–15.5)
WBC: 7.5 10*3/uL (ref 4.0–10.5)
nRBC: 0 % (ref 0.0–0.2)

## 2023-11-21 LAB — BASIC METABOLIC PANEL
Anion gap: 6 (ref 5–15)
BUN: 14 mg/dL (ref 6–20)
CO2: 26 mmol/L (ref 22–32)
Calcium: 8.9 mg/dL (ref 8.9–10.3)
Chloride: 106 mmol/L (ref 98–111)
Creatinine, Ser: 0.69 mg/dL (ref 0.44–1.00)
GFR, Estimated: >60 mL/min (ref >60)
Glucose, Bld: 95 mg/dL (ref 70–99)
Potassium: 4.2 mmol/L (ref 3.5–5.1)
Sodium: 138 mmol/L (ref 135–145)

## 2023-11-21 LAB — D-DIMER, QUANTITATIVE: D-Dimer, Quant: <0.27 ug{FEU}/mL (ref 0.00–0.50)

## 2023-11-21 LAB — HCG, SERUM, QUALITATIVE: Preg, Serum: NEGATIVE

## 2023-11-21 MED ORDER — ONDANSETRON HCL 4 MG/2ML IJ SOLN
4.0000 mg | Freq: Once | INTRAMUSCULAR | Status: AC
  Administered 2023-11-21: 4 mg via INTRAVENOUS
  Filled 2023-11-21: qty 2

## 2023-11-21 NOTE — ED Triage Notes (Signed)
Pt states that she has had  these  palpitations  for a long time saw a dr  and takes anxiety  hydroxyzine states has taken it but no better has a  monitor on since last Tuesday, some nausea and sob she states

## 2023-11-21 NOTE — Discharge Instructions (Signed)
You have been eval for your symptoms.  Fortunately no concerning findings were noted on today's exam.  Continue to wear a Zio patch for the recommended duration for further assessments of your condition.  You may following up with cardiology for outpatient evaluation.

## 2023-11-21 NOTE — ED Provider Notes (Signed)
[place] [facility] AT [facility] Provider Note   CSN: [id number] Arrival date & time: 11/21/23  1034     History  Chief Complaint  Patient presents with   Palpitations    Heidi David is a 27 y.o. female.  The history is provided by the patient and medical records. No language interpreter was used.  Palpitations    27 year old female presenting with complaints of heart palpitation.  Patient states for the past several months she has had recurrent episodes of heart palpitation.  She described as heart skipping a beat, that happens sporadically and when it does she feels lightheadedness, having trouble catching her breath, having some shortness of breath, and felt nauseous.  She does not endorse any associated fever, productive cough, hemoptysis, leg swelling calf tenderness neck pain or trouble swallowing.  She denies any history of thyroid disease, denies any history of PE or DVT she is not on any recent oral hormone.  Patient also denies taking any energy drinks, although increased caffeine intake.  No no other over-the-counter medication.  Patient mention she is currently wearing a ZIO's heart monitoring device for the past several days but due to increased frequency of her symptoms thus prompting this ER visit.  Home Medications Prior to Admission medications   Medication Sig Start Date End Date Taking? Authorizing Provider  dicyclomine (BENTYL) 20 MG tablet Take 1 tablet (20 mg total) by mouth 2 (two) times daily. 04/24/23   Carmel Sacramento A, PA-C  hydrOXYzine (ATARAX/VISTARIL) 25 MG tablet Take 0.5-1 tablets (12.5-25 mg total) by mouth at bedtime as needed. Patient taking differently: Take 12.5 mg by mouth at bedtime as needed for anxiety. 11/17/19   Terrilee Files, MD  lidocaine (LIDODERM) 5 % Place 1 patch onto the skin daily. Remove & Discard patch within 12 hours or as directed by MD Patient not taking: Reported on 02/22/2022 01/28/22   Henderly,  Britni A, PA-C  meloxicam (MOBIC) 7.5 MG tablet Take 1 tablet (7.5 mg total) by mouth daily as needed. 02/13/22   Myra Rude, MD  pantoprazole (PROTONIX) 40 MG tablet Take 1 tablet (40 mg total) by mouth daily. 04/24/23   Carmel Sacramento A, PA-C  silver sulfADIAZINE (SILVADENE) 1 % cream Apply 1 application topically daily. 01/17/19   Charlynne Pander, MD      Allergies    Codeine, Hydrocodone, and Oxycodone    Review of Systems   Review of Systems  Cardiovascular:  Positive for palpitations.  All other systems reviewed and are negative.   Physical Exam Updated Vital Signs BP 114/75 (BP Location: Right Arm)   Pulse 88   Temp 98.1 F (36.7 C) (Oral)   Resp 16   Ht 5\' 2"  (1.575 m)   Wt 94.3 kg   SpO2 98%   BMI 38.04 kg/m  Physical Exam Vitals and nursing note reviewed.  Constitutional:      General: She is not in acute distress.    Appearance: She is well-developed.  HENT:     Head: Atraumatic.     Mouth/Throat:     Mouth: Mucous membranes are moist.  Eyes:     Conjunctiva/sclera: Conjunctivae normal.  Neck:     Vascular: No carotid bruit.     Comments: No thyromegaly Cardiovascular:     Rate and Rhythm: Normal rate and regular rhythm.  Pulmonary:     Effort: Pulmonary effort is normal.     Breath sounds: No wheezing, rhonchi or rales.  Abdominal:  Palpations: Abdomen is soft.     Tenderness: There is no abdominal tenderness.  Musculoskeletal:        General: Normal range of motion.     Cervical back: Normal range of motion and neck supple. No rigidity or tenderness.  Lymphadenopathy:     Cervical: No cervical adenopathy.  Skin:    General: Skin is warm.     Findings: No rash.  Neurological:     Mental Status: She is alert. Mental status is at baseline.  Psychiatric:        Mood and Affect: Mood normal.     ED Results / Procedures / Treatments   Labs (all labs ordered are listed, but only abnormal results are displayed) Labs Reviewed  BASIC  METABOLIC PANEL  CBC WITH DIFFERENTIAL/PLATELET  D-DIMER, QUANTITATIVE  HCG, SERUM, QUALITATIVE    EKG None  Date: 11/21/2023  Rate: 88  Rhythm: normal sinus rhythm  QRS Axis: normal  Intervals: normal  ST/T Wave abnormalities: normal  Conduction Disutrbances: none  Narrative Interpretation:   Old EKG Reviewed: No significant changes noted    Radiology DG Chest 2 View  Result Date: 11/21/2023 CLINICAL DATA:  Shortness of breath. EXAM: CHEST - 2 VIEW COMPARISON:  None Available. FINDINGS: The heart size and mediastinal contours are within normal limits. Overlapping telemetry wires. Battery pack overlies the left chest wall. Both lungs are clear. No pleural effusion or pneumothorax. The visualized skeletal structures are unremarkable. IMPRESSION: No acute cardiopulmonary disease. Electronically Signed   By: Hart Robinsons M.D.   On: 11/21/2023 13:26    Procedures Procedures    Medications Ordered in ED Medications  ondansetron (ZOFRAN) injection 4 mg (4 mg Intravenous Given 11/21/23 1134)    ED Course/ Medical Decision Making/ A&P                                 Medical Decision Making Amount and/or Complexity of Data Reviewed Labs: ordered. Radiology: ordered.  Risk Prescription drug management.   BP 114/75 (BP Location: Right Arm)   Pulse 88   Temp 98.1 F (36.7 C) (Oral)   Resp 16   Ht 5\' 2"  (1.575 m)   Wt 94.3 kg   SpO2 98%   BMI 38.04 kg/m   38:103 AM  27 year old female presenting with complaints of heart palpitation.  Patient states for the past several months she has had recurrent episodes of heart palpitation.  She described as heart skipping a beat, that happens sporadically and when it does she feels lightheadedness, having trouble catching her breath, having some shortness of breath, and felt nauseous.  She does not endorse any associated fever, productive cough, hemoptysis, leg swelling calf tenderness neck pain or trouble swallowing.  She  denies any history of thyroid disease, denies any history of PE or DVT she is not on any recent oral hormone.  Patient also denies taking any energy drinks, although increased caffeine intake.  No no other over-the-counter medication.  Patient mention she is currently wearing a ZIO's heart monitoring device for the past several days but due to increased frequency of her symptoms thus prompting this ER visit.  Exam overall reassuring no evidence of thyromegaly, heart with normal rate and rhythm, lungs are clear to auscultation bilaterally abdomen is soft nontender patient has intact distal pulses to all 4 extremities.  She has a Zio patch on her left upper chest.  Vital sign normal-appearing, EKG with  normal sinus rhythm without any concerning arrhythmia.  -Labs ordered, independently viewed and interpreted by me.  Labs remarkable for reassuring lab value, normal WBC, normal electrolytes panel, negative d-dimer -The patient was maintained on a cardiac monitor.  I personally viewed and interpreted the cardiac monitored which showed an underlying rhythm of: NSR -Imaging independently viewed and interpreted by me and I agree with radiologist's interpretation.  Result remarkable for CXR without concerning changes -This patient presents to the ED for concern of palpitation, this involves an extensive number of treatment options, and is a complaint that carries with it a high risk of complications and morbidity.  The differential diagnosis includes anxiety, cardiac arrhythmia, dehydration, anemia, electrolytes derangement, thyroid disease -Co morbidities that complicate the patient evaluation includes none -Treatment includes monitoring -Reevaluation of the patient after these medicines showed that the patient improved -PCP office notes or outside notes reviewed -Escalation to admission/observation considered: patients feels much better, is comfortable with discharge, and will follow up with  PCP -Prescription medication considered, patient comfortable with continue hydroxyzine and continue wearing zio patch -Social Determinant of Health considered which includes tobacco use.          Final Clinical Impression(s) / ED Diagnoses Final diagnoses:  Palpitations    Rx / DC Orders ED Discharge Orders          Ordered    Ambulatory referral to Cardiology       Comments: If you have not heard from the Cardiology office within the next 72 hours please call [phone].   11/21/23 1333              Fayrene Helper, PA-C 11/21/23 1334    Benjiman Core, MD 11/21/23 1428
